# Patient Record
Sex: Male | Born: 1970 | Race: White | Hispanic: Yes | Marital: Married | Smoking: Never smoker
Health system: Southern US, Community
[De-identification: ages and names within clinical notes are randomized; demographics above are authoritative.]

## PROBLEM LIST (undated history)

## (undated) DIAGNOSIS — D1739 Benign lipomatous neoplasm of skin and subcutaneous tissue of other sites: Secondary | ICD-10-CM

## (undated) DIAGNOSIS — I10 Essential (primary) hypertension: Secondary | ICD-10-CM

## (undated) HISTORY — DX: Essential (primary) hypertension: I10

## (undated) HISTORY — DX: Benign lipomatous neoplasm of skin and subcutaneous tissue of other sites: D17.39

---

## 2007-02-12 ENCOUNTER — Emergency Department (HOSPITAL_COMMUNITY): Admission: EM | Admit: 2007-02-12 | Discharge: 2007-02-12 | Payer: Self-pay | Admitting: Emergency Medicine

## 2007-05-01 ENCOUNTER — Ambulatory Visit: Payer: Self-pay | Admitting: Family Medicine

## 2007-05-05 ENCOUNTER — Encounter (INDEPENDENT_AMBULATORY_CARE_PROVIDER_SITE_OTHER): Payer: Self-pay | Admitting: *Deleted

## 2007-05-05 LAB — CONVERTED CEMR LAB
Cholesterol: 251 mg/dL (ref 0–200)
Direct LDL: 184.2 mg/dL
Total CHOL/HDL Ratio: 6.1

## 2010-12-19 ENCOUNTER — Encounter: Payer: Self-pay | Admitting: Internal Medicine

## 2010-12-19 ENCOUNTER — Ambulatory Visit (INDEPENDENT_AMBULATORY_CARE_PROVIDER_SITE_OTHER): Payer: 59 | Admitting: Internal Medicine

## 2010-12-19 DIAGNOSIS — M549 Dorsalgia, unspecified: Secondary | ICD-10-CM | POA: Insufficient documentation

## 2010-12-19 DIAGNOSIS — L989 Disorder of the skin and subcutaneous tissue, unspecified: Secondary | ICD-10-CM | POA: Insufficient documentation

## 2010-12-19 DIAGNOSIS — Z0000 Encounter for general adult medical examination without abnormal findings: Secondary | ICD-10-CM

## 2010-12-19 DIAGNOSIS — Z136 Encounter for screening for cardiovascular disorders: Secondary | ICD-10-CM

## 2010-12-19 NOTE — Progress Notes (Signed)
  Subjective:    Patient ID: Troy [name], male    DOB: [birth year], 40 y.o.   MRN: [id number]  HPI NEW PT Needs a complete check up, in addition: --8 months ago, he smashed his R thumb, since then has a knot there although is getting smaller, mild pain. --many years h/o a skin lesion at L hip, has increased in size in the last year, no bleeding -- occ low back pain w/o radiation, usually in AM, better as he gets active   No past medical history on file.  Past Surgical History  Procedure Date  . No past surgeries    History   Social History  . Marital Status: Married    Spouse Name: N/A    Number of Children: 2  . Years of Education: N/A   Occupational History  . Holiday representative    Social History Main Topics  . Smoking status: Never Smoker   . Smokeless tobacco: Not on file  . Alcohol Use: Yes     socially   . Drug Use: No  . Sexually Active: Not on file   Other Topics Concern  . Not on file   Social History Narrative   Original from [place] but raised in the [place]   Family History  Problem Relation Age of Onset  . Hyperlipidemia      mother   . Coronary artery disease      uncle in their 55s  . Diabetes Neg Hx   . Cancer Neg Hx      Review of Systems  Constitutional: Negative for fever and fatigue.  Respiratory: Negative for cough and shortness of breath.   Cardiovascular: Negative for chest pain and leg swelling.  Gastrointestinal: Negative for nausea, vomiting, abdominal pain and blood in stool.  Genitourinary: Negative for dysuria, hematuria and difficulty urinating.       Objective:   Physical Exam  Constitutional: He is oriented to person, place, and time. He appears well-developed and well-nourished. No distress.  HENT:  Head: Normocephalic and atraumatic.  Neck: No thyromegaly present.       No neck LADs  Cardiovascular: Normal rate, regular rhythm and normal heart sounds.   No murmur heard. Pulmonary/Chest: Effort normal and breath sounds normal.  No respiratory distress. He has no wheezes. He has no rales.  Abdominal: Soft. He exhibits no distension. There is no tenderness. There is no rebound and no guarding.  Musculoskeletal: He exhibits no edema.  Neurological: He is alert and oriented to person, place, and time.  Skin: He is not diaphoretic.       At the L hip , he has a 1.5 pedunculated skin lesion, no hyperpigmented. Also R thumb (pulp) has a 2 mm induration, skin over it is normal  Psychiatric: He has a normal mood and affect. His behavior is normal. Judgment and thought content normal.          Assessment & Plan:

## 2010-12-19 NOTE — Assessment & Plan Note (Signed)
Stretching and motrin as needed Call if sx severe

## 2010-12-19 NOTE — Assessment & Plan Note (Addendum)
Td per pt ~ 08-2010 Labs +FH CAD: EKG today NSR  Counseled about a healthy diet-exercise

## 2010-12-19 NOTE — Assessment & Plan Note (Signed)
L hip-- recommend   to schedule OV for excision R thumb-- recommend observation, if not well in few months, will call for ortho referal

## 2010-12-19 NOTE — Patient Instructions (Signed)
Came back fasting: FLP CMP CBC TSH--- dx v70 Schedule another office visit for the excision of the skin lesion on the hip

## 2010-12-21 ENCOUNTER — Other Ambulatory Visit: Payer: Self-pay | Admitting: Internal Medicine

## 2010-12-21 DIAGNOSIS — Z0000 Encounter for general adult medical examination without abnormal findings: Secondary | ICD-10-CM

## 2010-12-24 ENCOUNTER — Other Ambulatory Visit: Payer: Self-pay | Admitting: Internal Medicine

## 2010-12-24 ENCOUNTER — Other Ambulatory Visit (INDEPENDENT_AMBULATORY_CARE_PROVIDER_SITE_OTHER): Payer: 59

## 2010-12-24 DIAGNOSIS — Z0000 Encounter for general adult medical examination without abnormal findings: Secondary | ICD-10-CM

## 2010-12-24 LAB — COMPREHENSIVE METABOLIC PANEL
ALT: 44 U/L (ref 0–53)
BUN: 16 mg/dL (ref 6–23)
Calcium: 9.2 mg/dL (ref 8.4–10.5)
Chloride: 107 mEq/L (ref 96–112)
Creatinine, Ser: 0.7 mg/dL (ref 0.4–1.5)
Potassium: 4.2 mEq/L (ref 3.5–5.1)
Sodium: 142 mEq/L (ref 135–145)

## 2010-12-24 LAB — LIPID PANEL: HDL: 50.7 mg/dL (ref >39.00)

## 2010-12-24 LAB — CBC WITH DIFFERENTIAL/PLATELET
Basophils Absolute: 0 10*3/uL (ref 0.0–0.1)
Basophils Relative: 0.6 % (ref 0.0–3.0)
HCT: 47.9 % (ref 39.0–52.0)
Hemoglobin: 16 g/dL (ref 13.0–17.0)
Lymphocytes Relative: 33.8 % (ref 12.0–46.0)
Lymphs Abs: 2.1 10*3/uL (ref 0.7–4.0)
MCV: 92.5 fl (ref 78.0–100.0)
Neutro Abs: 3.4 10*3/uL (ref 1.4–7.7)
Neutrophils Relative %: 55.2 % (ref 43.0–77.0)
Platelets: 205 10*3/uL (ref 150.0–400.0)
RBC: 5.17 Mil/uL (ref 4.22–5.81)

## 2010-12-27 ENCOUNTER — Telehealth: Payer: Self-pay

## 2010-12-27 DIAGNOSIS — E785 Hyperlipidemia, unspecified: Secondary | ICD-10-CM

## 2010-12-27 NOTE — Telephone Encounter (Signed)
Message copied by Beverely Low on Thu Dec 27, 2010  2:13 PM ------      Message from: Troy [name]      Created: Wed Dec 26, 2010 10:21 PM       Addendum:      I now see his LDL is very high, he has a family history of heart disease (uncle)      Please ask patient to come back in 6 months fasting to repeat his cholesterol panel dx hyperlipidemia

## 2010-12-27 NOTE — Telephone Encounter (Signed)
Message copied by Beverely Low on Thu Dec 27, 2010  2:21 PM ------      Message from: Willow Ora E      Created: Wed Dec 26, 2010 10:21 PM       Addendum:      I now see his LDL is very high, he has a family history of heart disease (uncle)      Please ask patient to come back in 6 months fasting to repeat his cholesterol panel dx hyperlipidemia

## 2010-12-27 NOTE — Telephone Encounter (Signed)
Pt aware and will call back to schedule 6 month follow up

## 2010-12-27 NOTE — Telephone Encounter (Signed)
LMTCB

## 2010-12-27 NOTE — Telephone Encounter (Signed)
Pt aware and will call to schedule 6 month. Lab order placed

## 2011-01-07 ENCOUNTER — Encounter: Payer: Self-pay | Admitting: Internal Medicine

## 2011-01-08 ENCOUNTER — Ambulatory Visit (INDEPENDENT_AMBULATORY_CARE_PROVIDER_SITE_OTHER): Payer: 59 | Admitting: Internal Medicine

## 2011-01-08 ENCOUNTER — Encounter: Payer: Self-pay | Admitting: Internal Medicine

## 2011-01-08 DIAGNOSIS — D1739 Benign lipomatous neoplasm of skin and subcutaneous tissue of other sites: Secondary | ICD-10-CM

## 2011-01-08 DIAGNOSIS — I10 Essential (primary) hypertension: Secondary | ICD-10-CM

## 2011-01-08 DIAGNOSIS — D229 Melanocytic nevi, unspecified: Secondary | ICD-10-CM

## 2011-01-08 DIAGNOSIS — D1730 Benign lipomatous neoplasm of skin and subcutaneous tissue of unspecified sites: Secondary | ICD-10-CM

## 2011-01-08 NOTE — Progress Notes (Signed)
  Subjective:    Patient ID: Troy [name], male    DOB: 31-May-1970, 40 y.o.   MRN: [id number]  HPI    Review of Systems     Objective:   Physical Exam        Assessment & Plan:  Procedure note: In a sterile fashion and under LA w/ lidocaine 2% w/o (~ 1 CC) we cut w/ scissors the pedunculated lesion of the L hip, silver nitrate used for hemostasis. Wound care discussed, to call if s/s of infection Path sent  ADDENDUM: path benign, see report

## 2012-07-24 ENCOUNTER — Emergency Department (HOSPITAL_COMMUNITY): Payer: No Typology Code available for payment source

## 2012-07-24 ENCOUNTER — Observation Stay (HOSPITAL_COMMUNITY)
Admission: EM | Admit: 2012-07-24 | Discharge: 2012-07-27 | Disposition: A | Discharge status: Home / Self Care | Payer: No Typology Code available for payment source | Attending: General Surgery | Admitting: General Surgery

## 2012-07-24 ENCOUNTER — Inpatient Hospital Stay: Admit: 2012-07-24 | Payer: Self-pay | Admitting: General Surgery

## 2012-07-24 ENCOUNTER — Encounter (HOSPITAL_COMMUNITY): Payer: Self-pay | Admitting: Emergency Medicine

## 2012-07-24 DIAGNOSIS — S0003XA Contusion of scalp, initial encounter: Secondary | ICD-10-CM | POA: Insufficient documentation

## 2012-07-24 DIAGNOSIS — S069X9A Unspecified intracranial injury with loss of consciousness of unspecified duration, initial encounter: Secondary | ICD-10-CM

## 2012-07-24 DIAGNOSIS — R413 Other amnesia: Secondary | ICD-10-CM | POA: Insufficient documentation

## 2012-07-24 DIAGNOSIS — K7689 Other specified diseases of liver: Secondary | ICD-10-CM | POA: Insufficient documentation

## 2012-07-24 DIAGNOSIS — S62102A Fracture of unspecified carpal bone, left wrist, initial encounter for closed fracture: Secondary | ICD-10-CM

## 2012-07-24 DIAGNOSIS — IMO0002 Reserved for concepts with insufficient information to code with codable children: Secondary | ICD-10-CM | POA: Insufficient documentation

## 2012-07-24 DIAGNOSIS — R109 Unspecified abdominal pain: Secondary | ICD-10-CM | POA: Insufficient documentation

## 2012-07-24 DIAGNOSIS — K5730 Diverticulosis of large intestine without perforation or abscess without bleeding: Secondary | ICD-10-CM | POA: Insufficient documentation

## 2012-07-24 DIAGNOSIS — K8020 Calculus of gallbladder without cholecystitis without obstruction: Secondary | ICD-10-CM | POA: Insufficient documentation

## 2012-07-24 DIAGNOSIS — S0990XA Unspecified injury of head, initial encounter: Secondary | ICD-10-CM | POA: Insufficient documentation

## 2012-07-24 DIAGNOSIS — S62109A Fracture of unspecified carpal bone, unspecified wrist, initial encounter for closed fracture: Secondary | ICD-10-CM

## 2012-07-24 DIAGNOSIS — S5290XA Unspecified fracture of unspecified forearm, initial encounter for closed fracture: Secondary | ICD-10-CM

## 2012-07-24 DIAGNOSIS — S1093XA Contusion of unspecified part of neck, initial encounter: Secondary | ICD-10-CM | POA: Insufficient documentation

## 2012-07-24 DIAGNOSIS — S52509A Unspecified fracture of the lower end of unspecified radius, initial encounter for closed fracture: Principal | ICD-10-CM | POA: Insufficient documentation

## 2012-07-24 DIAGNOSIS — M25539 Pain in unspecified wrist: Secondary | ICD-10-CM | POA: Insufficient documentation

## 2012-07-24 LAB — CBC WITH DIFFERENTIAL/PLATELET
HCT: 42.9 % (ref 39.0–52.0)
Lymphocytes Relative: 47 % — ABNORMAL HIGH (ref 12–46)
Lymphs Abs: 6.3 10*3/uL — ABNORMAL HIGH (ref 0.7–4.0)
MCH: 31.2 pg (ref 26.0–34.0)
MCHC: 35.9 g/dL (ref 30.0–36.0)
MCV: 86.8 fL (ref 78.0–100.0)
Neutro Abs: 5.5 10*3/uL (ref 1.7–7.7)
WBC: 13.4 10*3/uL — ABNORMAL HIGH (ref 4.0–10.5)

## 2012-07-24 LAB — POCT I-STAT, CHEM 8
Calcium, Ion: 1.16 mmol/L (ref 1.12–1.23)
Creatinine, Ser: 0.9 mg/dL (ref 0.50–1.35)
Potassium: 3 mEq/L — ABNORMAL LOW (ref 3.5–5.1)
Sodium: 144 mEq/L (ref 135–145)

## 2012-07-24 LAB — PROTIME-INR
INR: 1.01 (ref 0.00–1.49)
Prothrombin Time: 13.2 seconds (ref 11.6–15.2)

## 2012-07-24 MED ORDER — IOHEXOL 300 MG/ML  SOLN
80.0000 mL | Freq: Once | INTRAMUSCULAR | Status: AC | PRN
  Administered 2012-07-24: 80 mL via INTRAVENOUS

## 2012-07-24 MED ORDER — HYDROMORPHONE HCL PF 1 MG/ML IJ SOLN
INTRAMUSCULAR | Status: AC
  Filled 2012-07-24: qty 1

## 2012-07-24 MED ORDER — PANTOPRAZOLE SODIUM 40 MG IV SOLR
40.0000 mg | Freq: Every day | INTRAVENOUS | Status: DC
  Administered 2012-07-25: 40 mg via INTRAVENOUS
  Filled 2012-07-24 (×3): qty 40

## 2012-07-24 MED ORDER — PANTOPRAZOLE SODIUM 40 MG PO TBEC
40.0000 mg | DELAYED_RELEASE_TABLET | Freq: Every day | ORAL | Status: DC
  Administered 2012-07-26 – 2012-07-27 (×2): 40 mg via ORAL
  Filled 2012-07-24 (×2): qty 1

## 2012-07-24 MED ORDER — HYDROMORPHONE HCL PF 1 MG/ML IJ SOLN
1.0000 mg | Freq: Once | INTRAMUSCULAR | Status: AC
  Administered 2012-07-24: 1 mg via INTRAVENOUS
  Filled 2012-07-24: qty 1

## 2012-07-24 MED ORDER — ONDANSETRON HCL 4 MG/2ML IJ SOLN
INTRAMUSCULAR | Status: AC
  Filled 2012-07-24: qty 2

## 2012-07-24 MED ORDER — ONDANSETRON HCL 4 MG/2ML IJ SOLN
4.0000 mg | Freq: Four times a day (QID) | INTRAMUSCULAR | Status: DC | PRN
  Administered 2012-07-24: 4 mg via INTRAVENOUS
  Filled 2012-07-24: qty 2

## 2012-07-24 MED ORDER — HYDROMORPHONE HCL PF 1 MG/ML IJ SOLN
1.0000 mg | Freq: Once | INTRAMUSCULAR | Status: AC
  Administered 2012-07-24: 1 mg via INTRAVENOUS

## 2012-07-24 MED ORDER — FENTANYL CITRATE 0.05 MG/ML IJ SOLN
100.0000 ug | Freq: Once | INTRAMUSCULAR | Status: AC
  Administered 2012-07-24: 100 ug via INTRAVENOUS
  Filled 2012-07-24: qty 2

## 2012-07-24 MED ORDER — ONDANSETRON HCL 4 MG PO TABS: 4.0000 mg | ORAL_TABLET | Freq: Four times a day (QID) | ORAL | Status: DC | PRN

## 2012-07-24 MED ORDER — ONDANSETRON HCL 4 MG/2ML IJ SOLN
4.0000 mg | Freq: Once | INTRAMUSCULAR | Status: AC
  Administered 2012-07-24: 4 mg via INTRAVENOUS

## 2012-07-24 MED ORDER — DEXTROSE-NACL 5-0.9 % IV SOLN
INTRAVENOUS | Status: DC
  Administered 2012-07-24: 23:00:00 via INTRAVENOUS

## 2012-07-24 MED ORDER — HYDROMORPHONE HCL PF 1 MG/ML IJ SOLN
1.0000 mg | INTRAMUSCULAR | Status: DC | PRN
  Administered 2012-07-24: 1 mg via INTRAVENOUS
  Filled 2012-07-24: qty 1
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
## 2012-07-24 NOTE — ED Notes (Signed)
Called and updated OR on pt status.
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
## 2012-07-24 NOTE — ED Notes (Signed)
Hand surgeon in room at this time

## 2012-07-24 NOTE — Preoperative (Signed)
Beta Blockers   Reason not to administer Beta Blockers:Not Applicable 
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
## 2012-07-25 ENCOUNTER — Encounter (HOSPITAL_COMMUNITY): Payer: Self-pay | Admitting: Certified Registered Nurse Anesthetist

## 2012-07-25 ENCOUNTER — Encounter (HOSPITAL_COMMUNITY): Admission: EM | Disposition: A | Discharge status: Home / Self Care | Payer: Self-pay | Source: Home / Self Care

## 2012-07-25 ENCOUNTER — Inpatient Hospital Stay (HOSPITAL_COMMUNITY): Payer: No Typology Code available for payment source | Admitting: Certified Registered Nurse Anesthetist

## 2012-07-25 ENCOUNTER — Encounter (HOSPITAL_COMMUNITY): Payer: Self-pay | Admitting: Anesthesiology

## 2012-07-25 HISTORY — PX: ORIF RADIAL FRACTURE: SHX5113

## 2012-07-25 SURGERY — OPEN REDUCTION INTERNAL FIXATION (ORIF) RADIAL FRACTURE
Anesthesia: General | Site: Arm Lower | Laterality: Left | Wound class: Clean

## 2012-07-25 MED ORDER — LIDOCAINE HCL (CARDIAC) 20 MG/ML IV SOLN
INTRAVENOUS | Status: DC | PRN
  Administered 2012-07-25: 100 mg via INTRAVENOUS

## 2012-07-25 MED ORDER — BUPIVACAINE HCL 0.25 % IJ SOLN
INTRAMUSCULAR | Status: DC | PRN
  Administered 2012-07-25: 18 mL

## 2012-07-25 MED ORDER — OXYCODONE HCL 5 MG/5ML PO SOLN: 5.0000 mg | Freq: Once | ORAL | Status: AC | PRN

## 2012-07-25 MED ORDER — ONDANSETRON HCL 4 MG/2ML IJ SOLN
INTRAMUSCULAR | Status: DC | PRN
  Administered 2012-07-25: 4 mg via INTRAVENOUS

## 2012-07-25 MED ORDER — CEFAZOLIN SODIUM-DEXTROSE 2-3 GM-% IV SOLR
INTRAVENOUS | Status: DC | PRN
  Administered 2012-07-25: 2 g via INTRAVENOUS

## 2012-07-25 MED ORDER — HYDROMORPHONE HCL PF 1 MG/ML IJ SOLN
1.0000 mg | INTRAMUSCULAR | Status: DC | PRN
  Administered 2012-07-25 (×2): 1 mg via INTRAVENOUS
  Administered 2012-07-25 – 2012-07-26 (×2): 2 mg via INTRAVENOUS
  Administered 2012-07-26: 1 mg via INTRAVENOUS
  Filled 2012-07-25: qty 2
  Filled 2012-07-25 (×2): qty 1
  Filled 2012-07-25: qty 2
  Filled 2012-07-25: qty 1

## 2012-07-25 MED ORDER — OXYCODONE HCL 5 MG PO TABS
5.0000 mg | ORAL_TABLET | Freq: Once | ORAL | Status: AC | PRN
  Administered 2012-07-25: 5 mg via ORAL
  Filled 2012-07-25: qty 1

## 2012-07-25 MED ORDER — HYDROMORPHONE HCL PF 1 MG/ML IJ SOLN
1.0000 mg | Freq: Once | INTRAMUSCULAR | Status: AC
  Administered 2012-07-25: 1 mg via INTRAVENOUS
  Filled 2012-07-25: qty 1

## 2012-07-25 MED ORDER — FENTANYL CITRATE 0.05 MG/ML IJ SOLN
INTRAMUSCULAR | Status: DC | PRN
  Administered 2012-07-25 (×5): 50 ug via INTRAVENOUS

## 2012-07-25 MED ORDER — METOPROLOL TARTRATE 1 MG/ML IV SOLN
INTRAVENOUS | Status: DC | PRN
  Administered 2012-07-25: 2 mg via INTRAVENOUS
  Administered 2012-07-25: 1 mg via INTRAVENOUS

## 2012-07-25 MED ORDER — LACTATED RINGERS IV SOLN
INTRAVENOUS | Status: DC | PRN
  Administered 2012-07-25 (×2): via INTRAVENOUS

## 2012-07-25 MED ORDER — 0.9 % SODIUM CHLORIDE (POUR BTL) OPTIME
TOPICAL | Status: DC | PRN
  Administered 2012-07-25: 1000 mL

## 2012-07-25 MED ORDER — MIDAZOLAM HCL 5 MG/5ML IJ SOLN
INTRAMUSCULAR | Status: DC | PRN
  Administered 2012-07-25: 2 mg via INTRAVENOUS

## 2012-07-25 MED ORDER — CHLORHEXIDINE GLUCONATE 0.12 % MT SOLN: 15.0000 mL | Freq: Two times a day (BID) | OROMUCOSAL | Status: DC

## 2012-07-25 MED ORDER — BIOTENE DRY MOUTH MT LIQD
15.0000 mL | Freq: Two times a day (BID) | OROMUCOSAL | Status: DC
  Administered 2012-07-25: 15 mL via OROMUCOSAL

## 2012-07-25 MED ORDER — DEXAMETHASONE SODIUM PHOSPHATE 4 MG/ML IJ SOLN
INTRAMUSCULAR | Status: DC | PRN
  Administered 2012-07-25: 8 mg via INTRAVENOUS

## 2012-07-25 MED ORDER — METOCLOPRAMIDE HCL 5 MG/ML IJ SOLN
INTRAMUSCULAR | Status: DC | PRN
  Administered 2012-07-25: 10 mg via INTRAVENOUS

## 2012-07-25 MED ORDER — PROPOFOL 10 MG/ML IV BOLUS
INTRAVENOUS | Status: DC | PRN
  Administered 2012-07-25: 200 mg via INTRAVENOUS

## 2012-07-25 MED ORDER — METOCLOPRAMIDE HCL 5 MG/ML IJ SOLN: 10.0000 mg | Freq: Once | INTRAMUSCULAR | Status: AC | PRN

## 2012-07-25 MED ORDER — HYDROMORPHONE HCL PF 1 MG/ML IJ SOLN
0.2500 mg | INTRAMUSCULAR | Status: DC | PRN
  Administered 2012-07-25: via INTRAVENOUS
  Filled 2012-07-25 (×2): qty 1

## 2012-07-25 SURGICAL SUPPLY — 46 items
BANDAGE ELASTIC 3 VELCRO ST LF (GAUZE/BANDAGES/DRESSINGS) ×2 IMPLANT
BANDAGE ELASTIC 4 VELCRO ST LF (GAUZE/BANDAGES/DRESSINGS) ×1 IMPLANT
BNDG CMPR 9X4 STRL LF SNTH (GAUZE/BANDAGES/DRESSINGS) ×1
BNDG ESMARK 4X9 LF (GAUZE/BANDAGES/DRESSINGS) ×1 IMPLANT
CANISTER SUCTION 1500CC (MISCELLANEOUS) ×2 IMPLANT
CHLORAPREP W/TINT 26ML (MISCELLANEOUS) ×2 IMPLANT
CLOTH BEACON ORANGE TIMEOUT ST (SAFETY) ×2 IMPLANT
CORDS BIPOLAR (ELECTRODE) ×2 IMPLANT
COVER SURGICAL LIGHT HANDLE (MISCELLANEOUS) ×2 IMPLANT
CUFF TOURNIQUET SINGLE 18IN (TOURNIQUET CUFF) ×2 IMPLANT
CUFF TOURNIQUET SINGLE 24IN (TOURNIQUET CUFF) IMPLANT
DRAIN TLS ROUND 10FR (DRAIN) IMPLANT
DRAPE OEC MINIVIEW 54X84 (DRAPES) ×2 IMPLANT
DRAPE SURG 17X23 STRL (DRAPES) ×2 IMPLANT
GAUZE XEROFORM 1X8 LF (GAUZE/BANDAGES/DRESSINGS) ×2 IMPLANT
GLOVE BIOGEL M STRL SZ7.5 (GLOVE) ×2 IMPLANT
GOWN PREVENTION PLUS XLARGE (GOWN DISPOSABLE) ×2 IMPLANT
GOWN STRL REIN XL XLG (GOWN DISPOSABLE) ×2 IMPLANT
KIT BASIN OR (CUSTOM PROCEDURE TRAY) ×2 IMPLANT
NEEDLE HYPO 22GX1.5 SAFETY (NEEDLE) ×1 IMPLANT
NS IRRIG 1000ML POUR BTL (IV SOLUTION) ×2 IMPLANT
PACK ORTHO EXTREMITY (CUSTOM PROCEDURE TRAY) ×2 IMPLANT
PAD CAST 3X4 CTTN HI CHSV (CAST SUPPLIES) IMPLANT
PAD CAST 4YDX4 CTTN HI CHSV (CAST SUPPLIES) IMPLANT
PADDING CAST ABS 4INX4YD NS (CAST SUPPLIES) ×1
PADDING CAST ABS COTTON 4X4 ST (CAST SUPPLIES) ×1 IMPLANT
PADDING CAST COTTON 3X4 STRL (CAST SUPPLIES)
PADDING CAST COTTON 4X4 STRL (CAST SUPPLIES) ×2
PEG THREADED 2.5MMX20MM LONG (Peg) ×7 IMPLANT
PLATE SHORT 21.6X48.9 NRRW LT (Plate) ×1 IMPLANT
SCREW CORT 3.5X16 LNG (Screw) ×3 IMPLANT
SPLINT FIBERGLASS 4X15 (CAST SUPPLIES) ×1 IMPLANT
SPLINT PLASTER CAST XFAST 4X15 (CAST SUPPLIES) IMPLANT
SPLINT PLASTER XTRA FAST SET 4 (CAST SUPPLIES)
SPONGE GAUZE 4X4 12PLY (GAUZE/BANDAGES/DRESSINGS) ×1 IMPLANT
SUT ETHILON 3 0 PS 1 (SUTURE) ×2 IMPLANT
SUT ETHILON 4 0 PS 2 18 (SUTURE) ×2 IMPLANT
SUT VIC AB 3-0 PS1 18 (SUTURE) ×2
SUT VIC AB 3-0 PS1 18XBRD (SUTURE) ×1 IMPLANT
SUT VICRYL 4-0 PS2 18IN ABS (SUTURE) ×2 IMPLANT
SYR BULB 3OZ (MISCELLANEOUS) ×2 IMPLANT
SYR CONTROL 10ML LL (SYRINGE) ×1 IMPLANT
TOWEL OR 17X24 6PK STRL BLUE (TOWEL DISPOSABLE) ×2 IMPLANT
TUBE CONNECTING 20X1/4 (TUBING) ×2 IMPLANT
UNDERPAD 30X30 INCONTINENT (UNDERPADS AND DIAPERS) ×2 IMPLANT
WATER STERILE IRR 1000ML POUR (IV SOLUTION) ×2 IMPLANT
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
## 2012-07-25 NOTE — Transfer of Care (Signed)
Immediate Anesthesia Transfer of Care Note  Patient: Troy [name]  Procedure(s) Performed: Procedure(s): OPEN REDUCTION INTERNAL FIXATION (ORIF) Distal RADIus FRACTURE (Left)  Patient Location: PACU  Anesthesia Type:General  Level of Consciousness: sedated  Airway & Oxygen Therapy: Patient Spontanous Breathing and Patient connected to nasal cannula oxygen  Post-op Assessment: Report given to PACU RN and Post -op Vital signs reviewed and stable  Post vital signs: Reviewed and stable  Complications: No apparent anesthesia complications

## 2012-07-25 NOTE — Anesthesia Postprocedure Evaluation (Signed)
Anesthesia Post Note  Patient: Troy [name]  Procedure(s) Performed: Procedure(s) (LRB): OPEN REDUCTION INTERNAL FIXATION (ORIF) Distal RADIus FRACTURE (Left)  Anesthesia type: General  Patient location: PACU  Post pain: Pain level controlled  Post assessment: Patient's Cardiovascular Status Stable  Last Vitals:  Filed Vitals:   07/25/12 0958  BP: 139/79  Pulse: 93  Temp: 36.7 C  Resp: 9    Post vital signs: Reviewed and stable  Level of consciousness: alert  Complications: No apparent anesthesia complications

## 2012-07-25 NOTE — Anesthesia Preprocedure Evaluation (Addendum)
Anesthesia Evaluation  Patient identified by MRN, date of birth, ID band Patient awake    Reviewed: Allergy & Precautions, H&P , NPO status , Patient's Chart, lab work & pertinent test results, reviewed documented beta blocker date and time   Airway Mallampati: II TM Distance: >3 FB Neck ROM: full    Dental  (+) Dental Advisory Given and Teeth Intact   Pulmonary neg pulmonary ROS,  breath sounds clear to auscultation        Cardiovascular negative cardio ROS  Rhythm:regular     Neuro/Psych negative neurological ROS  negative psych ROS   GI/Hepatic negative GI ROS, Neg liver ROS,   Endo/Other  negative endocrine ROS  Renal/GU negative Renal ROS  negative genitourinary   Musculoskeletal   Abdominal   Peds  Hematology negative hematology ROS (+)   Anesthesia Other Findings See surgeon's H&P   Reproductive/Obstetrics negative OB ROS                          Anesthesia Physical Anesthesia Plan  ASA: I  Anesthesia Plan: General   Post-op Pain Management:    Induction: Intravenous  Airway Management Planned: LMA  Additional Equipment:   Intra-op Plan:   Post-operative Plan:   Informed Consent: I have reviewed the patients History and Physical, chart, labs and discussed the procedure including the risks, benefits and alternatives for the proposed anesthesia with the patient or authorized representative who has indicated his/her understanding and acceptance.   Dental Advisory Given  Plan Discussed with: CRNA and Surgeon  Anesthesia Plan Comments:         Anesthesia Quick Evaluation
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
## 2012-07-25 NOTE — Anesthesia Procedure Notes (Signed)
Procedure Name: LMA Insertion Date/Time: 07/25/2012 8:37 AM Performed by: Alanda Amass A Pre-anesthesia Checklist: Patient identified, Timeout performed, Emergency Drugs available, Suction available and Patient being monitored Patient Re-evaluated:Patient Re-evaluated prior to inductionOxygen Delivery Method: Circle system utilized Preoxygenation: Pre-oxygenation with 100% oxygen Intubation Type: IV induction Ventilation: Mask ventilation without difficulty LMA Size: 5.0 Tube type: Oral Number of attempts: 1 Tube secured with: Tape Dental Injury: Teeth and Oropharynx as per pre-operative assessment

## 2012-07-25 NOTE — Op Note (Signed)
07/24/2012 - 07/25/2012  9:45 AM  PATIENT:  Marcello Moores  42 y.o. male  PRE-OPERATIVE DIAGNOSIS:  Left Distal Radius Fracture  POST-OPERATIVE DIAGNOSIS:  same  PROCEDURE:  Procedure(s): OPEN REDUCTION INTERNAL FIXATION (ORIF) Distal RADIus FRACTURE  SURGEON:  Surgeon(s): Johnette Abraham, MD  ANESTHESIA:   general  SPECIMEN:  No Specimen  FINDINGS:  comminuted intraarticular fx  DISPOSITION OF SPECIMEN:  {SPECIMEN DISPOSITION:[id number]  PATIENT DISPOSITION:  PACU - hemodynamically stable.

## 2012-07-26 ENCOUNTER — Observation Stay (HOSPITAL_COMMUNITY): Payer: No Typology Code available for payment source

## 2012-07-26 MED ORDER — OXYCODONE HCL 5 MG PO TABS: 5.0000 mg | ORAL_TABLET | ORAL | Status: DC | PRN

## 2012-07-26 MED ORDER — OXYCODONE HCL 5 MG PO TABS
5.0000 mg | ORAL_TABLET | ORAL | Status: DC | PRN
  Administered 2012-07-26 – 2012-07-27 (×5): 10 mg via ORAL
  Filled 2012-07-26 (×5): qty 2

## 2012-07-26 MED ORDER — ACETAMINOPHEN 325 MG PO TABS: ORAL_TABLET | ORAL | Status: DC

## 2012-07-26 MED ORDER — HYDROMORPHONE HCL PF 1 MG/ML IJ SOLN
0.5000 mg | INTRAMUSCULAR | Status: DC | PRN
  Administered 2012-07-26: 0.5 mg via INTRAVENOUS
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
## 2012-07-26 NOTE — Progress Notes (Signed)
Pt wants a repeat CT scan so I will get one now while he is here.

## 2012-07-26 NOTE — Progress Notes (Signed)
Orthopedic Tech Progress Note Patient Details:  Troy [name] [birth year] [id number]  Ortho Devices Type of Ortho Device: Other (comment) (sling and swathe) Ortho Device/Splint Location: left arm Ortho Device/Splint Interventions: Application   Nikki Dom 07/26/2012, 6:42 PM
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
## 2012-07-27 DIAGNOSIS — S0990XA Unspecified injury of head, initial encounter: Secondary | ICD-10-CM

## 2012-07-27 DIAGNOSIS — S62109A Fracture of unspecified carpal bone, unspecified wrist, initial encounter for closed fracture: Secondary | ICD-10-CM

## 2012-07-27 LAB — PATHOLOGIST SMEAR REVIEW

## 2012-07-27 MED ORDER — OXYCODONE-ACETAMINOPHEN 5-325 MG PO TABS: 1.0000 | ORAL_TABLET | ORAL | Status: DC | PRN
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
## 2012-07-27 NOTE — Progress Notes (Signed)
Patient examined and I agree with the assessment and plan  Violeta Gelinas, MD, MPH, FACS Pager: [phone]  07/27/2012 4:10 PM

## 2012-07-27 NOTE — Progress Notes (Signed)
Patient and wife received discharge instructions and prescription from Alona Bene, Charity fundraiser, Press photographer.  All questions answered.  Patient discharged home with wife.

## 2012-07-28 ENCOUNTER — Encounter (HOSPITAL_COMMUNITY): Payer: Self-pay | Admitting: General Surgery

## 2012-08-18 ENCOUNTER — Encounter: Payer: Self-pay | Admitting: Gastroenterology

## 2012-08-18 ENCOUNTER — Encounter: Payer: Self-pay | Admitting: Family Medicine

## 2012-08-18 ENCOUNTER — Ambulatory Visit (INDEPENDENT_AMBULATORY_CARE_PROVIDER_SITE_OTHER): Payer: 59 | Admitting: Family Medicine

## 2012-08-18 VITALS — BP 132/88 | HR 72 | Temp 99.1°F | Wt 180.4 lb

## 2012-08-18 DIAGNOSIS — R195 Other fecal abnormalities: Secondary | ICD-10-CM

## 2012-08-18 DIAGNOSIS — K625 Hemorrhage of anus and rectum: Secondary | ICD-10-CM

## 2012-08-18 DIAGNOSIS — K5900 Constipation, unspecified: Secondary | ICD-10-CM

## 2012-08-18 DIAGNOSIS — R110 Nausea: Secondary | ICD-10-CM

## 2012-08-18 MED ORDER — OMEPRAZOLE 40 MG PO CPDR: 40.0000 mg | DELAYED_RELEASE_CAPSULE | Freq: Every day | ORAL | Status: DC

## 2012-08-18 NOTE — Assessment & Plan Note (Signed)
Miralax, stool softener Secondary to narcotics

## 2012-08-18 NOTE — Patient Instructions (Addendum)
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
## 2012-08-18 NOTE — Assessment & Plan Note (Signed)
Omeprazole Referral to GI

## 2012-08-28 ENCOUNTER — Ambulatory Visit (INDEPENDENT_AMBULATORY_CARE_PROVIDER_SITE_OTHER): Payer: 59 | Admitting: Gastroenterology

## 2012-08-28 ENCOUNTER — Encounter: Payer: Self-pay | Admitting: Gastroenterology

## 2012-08-28 ENCOUNTER — Other Ambulatory Visit (INDEPENDENT_AMBULATORY_CARE_PROVIDER_SITE_OTHER): Payer: 59

## 2012-08-28 VITALS — BP 120/100 | HR 74 | Ht 64.5 in | Wt 180.4 lb

## 2012-08-28 DIAGNOSIS — K7689 Other specified diseases of liver: Secondary | ICD-10-CM

## 2012-08-28 DIAGNOSIS — K5730 Diverticulosis of large intestine without perforation or abscess without bleeding: Secondary | ICD-10-CM

## 2012-08-28 DIAGNOSIS — K625 Hemorrhage of anus and rectum: Secondary | ICD-10-CM

## 2012-08-28 DIAGNOSIS — K8020 Calculus of gallbladder without cholecystitis without obstruction: Secondary | ICD-10-CM

## 2012-08-28 DIAGNOSIS — K760 Fatty (change of) liver, not elsewhere classified: Secondary | ICD-10-CM

## 2012-08-28 LAB — CBC WITH DIFFERENTIAL/PLATELET
Basophils Absolute: 0.1 10*3/uL (ref 0.0–0.1)
Hemoglobin: 15.6 g/dL (ref 13.0–17.0)
Lymphocytes Relative: 27.4 % (ref 12.0–46.0)
Monocytes Relative: 8.8 % (ref 3.0–12.0)
Neutro Abs: 5.3 10*3/uL (ref 1.4–7.7)
RDW: 13.1 % (ref 11.5–14.6)
WBC: 8.6 10*3/uL (ref 4.5–10.5)

## 2012-08-28 LAB — HEPATIC FUNCTION PANEL
ALT: 56 U/L — ABNORMAL HIGH (ref 0–53)
AST: 29 U/L (ref 0–37)
Alkaline Phosphatase: 80 U/L (ref 39–117)
Bilirubin, Direct: 0.1 mg/dL (ref 0.0–0.3)
Total Bilirubin: 0.7 mg/dL (ref 0.3–1.2)

## 2012-08-28 LAB — IBC PANEL
Saturation Ratios: 19.5 % — ABNORMAL LOW (ref 20.0–50.0)
Transferrin: 245.6 mg/dL (ref 212.0–360.0)

## 2012-08-28 LAB — FERRITIN: Ferritin: 527.2 ng/mL — ABNORMAL HIGH (ref 22.0–322.0)

## 2012-08-28 MED ORDER — MOVIPREP 100 G PO SOLR: 1.0000 | Freq: Once | ORAL | Status: DC
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
## 2012-08-28 NOTE — Patient Instructions (Addendum)
You have been scheduled for a colonoscopy with propofol. Please follow written instructions given to you at your visit today.  Please pick up your prep kit at the pharmacy within the next 1-3 days. If you use inhalers (even only as needed), please bring them with you on the day of your procedure. Your physician has requested that you go to www.startemmi.com and enter the access code given to you at your visit today. This web site gives a general overview about your procedure. However, you should still follow specific instructions given to you by our office regarding your preparation for the procedure.  Your physician has requested that you go to the basement for lab work before leaving today: Anemia Panel, and Hepatic Function Panel  Please purchase Metamucil over the counter. Take as directed.  Please follow high fiber diet below.  You watched a video on Diverticulosis today.   ______________________________________________________________________________________________________________   High-Fiber Diet Fiber is found in fruits, vegetables, and grains. A high-fiber diet encourages the addition of more whole grains, legumes, fruits, and vegetables in your diet. The recommended amount of fiber for adult males is 38 g per day. For adult females, it is 25 g per day. Pregnant and lactating women should get 28 g of fiber per day. If you have a digestive or bowel problem, ask your caregiver for advice before adding high-fiber foods to your diet. Eat a variety of high-fiber foods instead of only a select few type of foods.  PURPOSE  To increase stool bulk.  To make bowel movements more regular to prevent constipation.  To lower cholesterol.  To prevent overeating. WHEN IS THIS DIET USED?  It may be used if you have constipation and hemorrhoids.  It may be used if you have uncomplicated diverticulosis (intestine condition) and irritable bowel syndrome.  It may be used if you need help  with weight management.  It may be used if you want to add it to your diet as a protective measure against atherosclerosis, diabetes, and cancer. SOURCES OF FIBER  Whole-grain breads and cereals.  Fruits, such as apples, oranges, bananas, berries, prunes, and pears.  Vegetables, such as green peas, carrots, sweet potatoes, beets, broccoli, cabbage, spinach, and artichokes.  Legumes, such split peas, soy, lentils.  Almonds. FIBER CONTENT IN FOODS Starches and Grains / Dietary Fiber (g)  Cheerios, 1 cup / 3 g  Corn Flakes cereal, 1 cup / 0.7 g  Rice crispy treat cereal, 1 cup / 0.3 g  Instant oatmeal (cooked),  cup / 2 g  Frosted wheat cereal, 1 cup / 5.1 g  Brown, long-grain rice (cooked), 1 cup / 3.5 g  White, long-grain rice (cooked), 1 cup / 0.6 g  Enriched macaroni (cooked), 1 cup / 2.5 g Legumes / Dietary Fiber (g)  Baked beans (canned, plain, or vegetarian),  cup / 5.2 g  Kidney beans (canned),  cup / 6.8 g  Pinto beans (cooked),  cup / 5.5 g Breads and Crackers / Dietary Fiber (g)  Plain or honey graham crackers, 2 squares / 0.7 g  Saltine crackers, 3 squares / 0.3 g  Plain, salted pretzels, 10 pieces / 1.8 g  Whole-wheat bread, 1 slice / 1.9 g  White bread, 1 slice / 0.7 g  Raisin bread, 1 slice / 1.2 g  Plain bagel, 3 oz / 2 g  Flour tortilla, 1 oz / 0.9 g  Corn tortilla, 1 small / 1.5 g  Hamburger or hotdog bun, 1 small / 0.9  g Fruits / Dietary Fiber (g)  Apple with skin, 1 medium / 4.4 g  Sweetened applesauce,  cup / 1.5 g  Banana,  medium / 1.5 g  Grapes, 10 grapes / 0.4 g  Orange, 1 small / 2.3 g  Raisin, 1.5 oz / 1.6 g  Melon, 1 cup / 1.4 g Vegetables / Dietary Fiber (g)  Green beans (canned),  cup / 1.3 g  Carrots (cooked),  cup / 2.3 g  Broccoli (cooked),  cup / 2.8 g  Peas (cooked),  cup / 4.4 g  Mashed potatoes,  cup / 1.6 g  Lettuce, 1 cup / 0.5 g  Corn (canned),  cup / 1.6 g  Tomato,  cup / 1.1  g Document Released: 04/01/2005 Document Revised: 10/01/2011 Document Reviewed: 07/04/2011 [facility] Patient Information 2013 [place], [place].                                                        We are excited to introduce MyChart, a new best-in-class service that provides you online access to important information in your electronic medical record. We want to make it easier for you to view your health information - all in one secure location - when and where you need it. We expect MyChart will enhance the quality of care and service we provide.  When you register for MyChart, you can:    View your test results.    Request appointments and receive appointment reminders via email.    Request medication renewals.    View your medical history, allergies, medications and immunizations.    Communicate with your physician's office through a password-protected site.    Conveniently print information such as your medication lists.  To find out if MyChart is right for you, please talk to a member of our clinical staff today. We will gladly answer your questions about this free health and wellness tool.  If you are age 40 or older and want a member of your family to have access to your record, you must provide written consent by completing a proxy form available at our office. Please speak to our clinical staff about guidelines regarding accounts for patients younger than age 48.  As you activate your MyChart account and need any technical assistance, please call the MyChart technical support line at [phone]) or email your question to [email or web address]. If you email your question(s), please include your name, a return phone number and the best time to reach you.  If you have non-urgent health-related questions, you can send a message to our office through MyChart at [email or web address]. If you have a medical emergency, call 911.  Thank you for using  MyChart as your new health and wellness resource!   MyChart licensed from [employer],  4540-9811. Patents Pending.

## 2012-09-04 ENCOUNTER — Ambulatory Visit (AMBULATORY_SURGERY_CENTER): Payer: 59 | Admitting: Gastroenterology

## 2012-09-04 ENCOUNTER — Encounter: Payer: Self-pay | Admitting: Gastroenterology

## 2012-09-04 VITALS — BP 122/85 | HR 69 | Temp 97.8°F | Resp 20 | Ht 65.0 in | Wt 180.0 lb

## 2012-09-04 DIAGNOSIS — K5730 Diverticulosis of large intestine without perforation or abscess without bleeding: Secondary | ICD-10-CM

## 2012-09-04 DIAGNOSIS — K7689 Other specified diseases of liver: Secondary | ICD-10-CM

## 2012-09-04 DIAGNOSIS — K625 Hemorrhage of anus and rectum: Secondary | ICD-10-CM

## 2012-09-04 DIAGNOSIS — K8020 Calculus of gallbladder without cholecystitis without obstruction: Secondary | ICD-10-CM

## 2012-09-04 DIAGNOSIS — Z1211 Encounter for screening for malignant neoplasm of colon: Secondary | ICD-10-CM

## 2012-09-04 DIAGNOSIS — D126 Benign neoplasm of colon, unspecified: Secondary | ICD-10-CM

## 2012-09-04 MED ORDER — SODIUM CHLORIDE 0.9 % IV SOLN: 500.0000 mL | INTRAVENOUS | Status: DC

## 2012-09-04 NOTE — Patient Instructions (Addendum)
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
## 2012-09-04 NOTE — Progress Notes (Signed)
Called to room to assist during endoscopic procedure.  Patient ID and intended procedure confirmed with present staff. Received instructions for my participation in the procedure from the performing physician.  

## 2012-09-04 NOTE — Op Note (Signed)
[facility] [address] [place], [address]   COLONOSCOPY PROCEDURE REPORT  PATIENT: Troy [name], Troy [name]  MR#: [id number] BIRTHDATE: Mar 22, 1971 , 42  yrs. old GENDER: Male ENDOSCOPIST: Mardella Layman, MD, [facility] REFERRED BY:  Willow Ora, M.D. PROCEDURE DATE:  09/04/2012 PROCEDURE:   Colonoscopy with biopsy ASA CLASS:   Class II INDICATIONS:Colorectal cancer screening and Rectal Bleeding. MEDICATIONS: propofol (Diprivan) 250mg  IV  DESCRIPTION OF PROCEDURE:   After the risks and benefits and of the procedure were explained, informed consent was obtained.  A digital rectal exam revealed no abnormalities of the rectum.    The LB [id number]  endoscope was introduced through the anus and advanced to the cecum, which was identified by both the appendix and ileocecal valve .  The quality of the prep was excellent, using MoviPrep .  The instrument was then slowly withdrawn as the colon was fully examined.     COLON FINDINGS: Moderate diverticulosis was noted in the descending colon and sigmoid colon.  No bleeding was noted from the diverticulosis.   A smooth flat polyp ranging between 3-17mm in size was found in the rectum.  A biopsy was performed using cold forceps.   A normal appearing cecum, ileocecal valve, and appendiceal orifice were identified.  The ascending, hepatic flexure, transverse, splenic flexure, descending, sigmoid colon and rectum appeared unremarkable.  No polyps or cancers were seen. Retroflexed views revealed no abnormalities.     The scope was then withdrawn from the patient and the procedure completed.  COMPLICATIONS: There were no complications. ENDOSCOPIC IMPRESSION: 1.   Moderate diverticulosis was noted in the descending colon and sigmoid colon 2.   Flat polyp ranging between 3-53mm in size was found in the rectum; biopsy was performed using cold forceps ,r/o adenoma 3. Rectum otherwise normal,no large hemorrhoids noted  today.  RECOMMENDATIONS: 1.  Await biopsy results 2.  Repeat colonoscopy in 5 years if polyp adenomatous; otherwise 10 years 3.  High fiber diet with liberal fluid intake. 4.  Metamucil or benefiber   REPEAT EXAM:  cc:  _______________________________ eSignedMardella Layman, MD, [facility] 09/04/2012 3:21 PM

## 2012-09-08 ENCOUNTER — Telehealth: Payer: Self-pay | Admitting: *Deleted

## 2012-09-08 NOTE — Telephone Encounter (Signed)
  Follow up Call-  Call back number 09/04/2012  Post procedure Call Back phone  # [phone]  Permission to leave phone message Yes     Patient questions:  Do you have a fever, pain , or abdominal swelling? no Pain Score  0 *  Have you tolerated food without any problems? yes  Have you been able to return to your normal activities? yes  Do you have any questions about your discharge instructions: Diet   no Medications  no Follow up visit  no  Do you have questions or concerns about your Care? no  Actions: * If pain score is 4 or above: No action needed, pain <4.

## 2012-09-11 ENCOUNTER — Encounter: Payer: Self-pay | Admitting: Gastroenterology

## 2013-07-10 ENCOUNTER — Ambulatory Visit: Payer: 59 | Admitting: Family Medicine

## 2014-01-12 ENCOUNTER — Telehealth: Payer: Self-pay | Admitting: *Deleted

## 2014-01-12 NOTE — Telephone Encounter (Signed)
Received medical record request from [place] [place] via mail for patient. Request forwarded to medical records. JG//CMA

## 2014-03-14 ENCOUNTER — Encounter: Payer: Self-pay | Admitting: Physician Assistant

## 2014-03-14 ENCOUNTER — Ambulatory Visit (INDEPENDENT_AMBULATORY_CARE_PROVIDER_SITE_OTHER): Payer: 59 | Admitting: Physician Assistant

## 2014-03-14 VITALS — BP 142/100 | HR 74 | Temp 98.3°F | Resp 16 | Ht 65.0 in | Wt 188.2 lb

## 2014-03-14 DIAGNOSIS — M47814 Spondylosis without myelopathy or radiculopathy, thoracic region: Secondary | ICD-10-CM

## 2014-03-14 DIAGNOSIS — J029 Acute pharyngitis, unspecified: Secondary | ICD-10-CM | POA: Insufficient documentation

## 2014-03-14 MED ORDER — MELOXICAM 15 MG PO TABS
15.0000 mg | ORAL_TABLET | Freq: Every day | ORAL | Status: DC
Start: 2014-03-14 — End: 2014-07-01

## 2014-03-14 MED ORDER — METHOCARBAMOL 500 MG PO TABS: 500.0000 mg | ORAL_TABLET | Freq: Every evening | ORAL | Status: DC | PRN

## 2014-03-14 NOTE — Patient Instructions (Signed)
For sore throat -- your symptoms seem viral in nature as strep testing was negative and your exam is unremarkable.  Increase your fluid intake.  Salt-water gargles may be beneficial.  OTC pain medications to help with pain. Place a humidifier in the bedroom.  For back -- apply topical aspercreme to back daily.  Take Mobic once daily with food.  TAke Robaxin at bedtime to help relieve muscle tension.  Follow-up with Dr. Larose Kells in 1 month.
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
## 2014-03-14 NOTE — Assessment & Plan Note (Signed)
Rx Meloxicam daily x 2 weeks. Robaxin at bedtime. Topical Aspercreme.  Follow-up with PCP in 1 month.

## 2014-03-14 NOTE — Progress Notes (Signed)
Pre visit review using our clinic review tool, if applicable. No additional management support is needed unless otherwise documented below in the visit note/SLS  

## 2014-03-14 NOTE — Assessment & Plan Note (Signed)
Rapid strep negative.  Exam unremarkable.  Suspect viral etiology.  Supportive measures discussed. Tylenol or Ibuprofen for pain.  Salt-water gargles may also be beneficial.  Humidifier in bedroom.  Follow-up PRN.

## 2014-03-21 ENCOUNTER — Other Ambulatory Visit: Payer: Self-pay

## 2014-04-27 ENCOUNTER — Ambulatory Visit (INDEPENDENT_AMBULATORY_CARE_PROVIDER_SITE_OTHER): Payer: 59 | Admitting: Internal Medicine

## 2014-04-27 ENCOUNTER — Encounter: Payer: Self-pay | Admitting: Internal Medicine

## 2014-04-27 VITALS — BP 151/100 | HR 64 | Temp 98.2°F | Ht 65.0 in | Wt 191.2 lb

## 2014-04-27 DIAGNOSIS — M545 Low back pain, unspecified: Secondary | ICD-10-CM

## 2014-04-27 DIAGNOSIS — IMO0001 Reserved for inherently not codable concepts without codable children: Secondary | ICD-10-CM

## 2014-04-27 DIAGNOSIS — R030 Elevated blood-pressure reading, without diagnosis of hypertension: Secondary | ICD-10-CM

## 2014-04-27 DIAGNOSIS — M47814 Spondylosis without myelopathy or radiculopathy, thoracic region: Secondary | ICD-10-CM

## 2014-04-27 MED ORDER — METHOCARBAMOL 500 MG PO TABS: 500.0000 mg | ORAL_TABLET | Freq: Every evening | ORAL | Status: DC | PRN

## 2014-04-27 NOTE — Assessment & Plan Note (Signed)
Elevated BP few weeks ago and again today. Recommend increase physical activity, check BPs, see instructions. F/u 6 weeks, reasses BP then
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
## 2014-04-27 NOTE — Progress Notes (Signed)
Pre visit review using our clinic review tool, if applicable. No additional management support is needed unless otherwise documented below in the visit note. 

## 2014-04-27 NOTE — Patient Instructions (Signed)
will refer  you to physical therapy Continue taking Robaxin at bedtime Take meloxicam only if moderate back pain  Check the  blood pressure 2 or 3 times a   week Be sure your blood pressure is between 110/65 and  145/85.  if it is consistently higher or lower, let me know   Schedule a visit to see me in 6 weeks for a physical exam, bring a record of your blood pressures Come back fasting.
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
## 2014-05-02 ENCOUNTER — Ambulatory Visit: Payer: 59 | Attending: Internal Medicine

## 2014-05-02 DIAGNOSIS — R293 Abnormal posture: Secondary | ICD-10-CM | POA: Insufficient documentation

## 2014-05-02 DIAGNOSIS — M545 Low back pain: Secondary | ICD-10-CM | POA: Diagnosis present

## 2014-05-02 DIAGNOSIS — M546 Pain in thoracic spine: Secondary | ICD-10-CM | POA: Insufficient documentation

## 2014-05-04 ENCOUNTER — Ambulatory Visit: Payer: 59 | Admitting: Rehabilitation

## 2014-05-04 DIAGNOSIS — M545 Low back pain: Secondary | ICD-10-CM | POA: Diagnosis not present

## 2014-05-10 ENCOUNTER — Ambulatory Visit: Payer: 59 | Admitting: Rehabilitation

## 2014-05-16 ENCOUNTER — Ambulatory Visit: Payer: 59 | Attending: Internal Medicine | Admitting: Rehabilitation

## 2014-05-16 DIAGNOSIS — M546 Pain in thoracic spine: Secondary | ICD-10-CM | POA: Insufficient documentation

## 2014-05-16 DIAGNOSIS — M545 Low back pain: Secondary | ICD-10-CM | POA: Diagnosis not present

## 2014-05-16 DIAGNOSIS — R293 Abnormal posture: Secondary | ICD-10-CM | POA: Diagnosis not present

## 2014-05-19 ENCOUNTER — Ambulatory Visit: Payer: 59 | Admitting: Rehabilitation

## 2014-05-19 DIAGNOSIS — M545 Low back pain: Secondary | ICD-10-CM | POA: Diagnosis not present

## 2014-05-23 ENCOUNTER — Ambulatory Visit: Payer: 59 | Admitting: Rehabilitation

## 2014-05-23 DIAGNOSIS — M545 Low back pain: Secondary | ICD-10-CM | POA: Diagnosis not present

## 2014-05-24 ENCOUNTER — Ambulatory Visit: Payer: 59

## 2014-05-24 DIAGNOSIS — M545 Low back pain: Secondary | ICD-10-CM | POA: Diagnosis not present

## 2014-05-30 ENCOUNTER — Ambulatory Visit: Payer: 59 | Admitting: Rehabilitation

## 2014-06-01 ENCOUNTER — Ambulatory Visit: Payer: 59

## 2014-06-01 DIAGNOSIS — M545 Low back pain, unspecified: Secondary | ICD-10-CM
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
## 2014-06-06 ENCOUNTER — Ambulatory Visit: Payer: 59 | Admitting: Rehabilitation

## 2014-06-07 ENCOUNTER — Encounter: Payer: 59 | Admitting: Rehabilitation

## 2014-06-08 ENCOUNTER — Ambulatory Visit: Payer: 59

## 2014-06-08 DIAGNOSIS — M545 Low back pain, unspecified: Secondary | ICD-10-CM
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
## 2014-06-15 ENCOUNTER — Ambulatory Visit: Payer: 59 | Attending: Internal Medicine

## 2014-06-15 DIAGNOSIS — M546 Pain in thoracic spine: Secondary | ICD-10-CM | POA: Diagnosis not present

## 2014-06-15 DIAGNOSIS — R293 Abnormal posture: Secondary | ICD-10-CM | POA: Diagnosis not present

## 2014-06-15 DIAGNOSIS — M545 Low back pain, unspecified: Secondary | ICD-10-CM
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
## 2014-06-15 NOTE — Patient Instructions (Signed)
Discussed importance of flexibility due to tight hip musculature increasing lumbopelvic mobility/stress.  Discussed yoga class at gym and to be sure not to do any poses that increase pain.  States there is a PT available for hire at his gym for specific exercise prescription with gym equipment.  Plans to use that resources as well going forward.
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
## 2014-07-01 ENCOUNTER — Encounter: Payer: Self-pay | Admitting: Internal Medicine

## 2014-07-01 ENCOUNTER — Ambulatory Visit (INDEPENDENT_AMBULATORY_CARE_PROVIDER_SITE_OTHER): Payer: 59 | Admitting: Internal Medicine

## 2014-07-01 VITALS — BP 124/74 | HR 71 | Temp 97.7°F | Ht 65.0 in | Wt 180.2 lb

## 2014-07-01 DIAGNOSIS — Z0000 Encounter for general adult medical examination without abnormal findings: Secondary | ICD-10-CM | POA: Diagnosis not present

## 2014-07-01 LAB — COMPREHENSIVE METABOLIC PANEL
ALBUMIN: 4.9 g/dL (ref 3.5–5.2)
ALK PHOS: 65 U/L (ref 39–117)
ALT: 37 U/L (ref 0–53)
AST: 21 U/L (ref 0–37)
BUN: 15 mg/dL (ref 6–23)
CALCIUM: 9.7 mg/dL (ref 8.4–10.5)
CO2: 25 mEq/L (ref 19–32)
Chloride: 106 mEq/L (ref 96–112)
Creat: 0.74 mg/dL (ref 0.50–1.35)
Glucose, Bld: 80 mg/dL (ref 70–99)
POTASSIUM: 4.3 meq/L (ref 3.5–5.3)
Sodium: 141 mEq/L (ref 135–145)
Total Bilirubin: 0.6 mg/dL (ref 0.2–1.2)
Total Protein: 7.2 g/dL (ref 6.0–8.3)

## 2014-07-01 LAB — LIPID PANEL
Cholesterol: 234 mg/dL — ABNORMAL HIGH (ref 0–200)
HDL: 50 mg/dL (ref >=40)
LDL CALC: 161 mg/dL — AB (ref 0–99)
Total CHOL/HDL Ratio: 4.7 Ratio
Triglycerides: 114 mg/dL (ref <150)
VLDL: 23 mg/dL (ref 0–40)

## 2014-07-01 NOTE — Patient Instructions (Signed)
Get your blood work before you leave   Check the  blood pressure 2 or 3 times a month   Be sure your blood pressure is between 110/65 and  145/85.  if it is consistently higher or lower, let me know    Come back to the office in 1 year   for a physical exam  Please schedule an appointment at the front desk    Come back fasting        Testicular Self-Exam A self-examination of your testicles involves looking at and feeling your testicles for abnormal lumps or swelling. Several things can cause swelling, lumps, or pain in your testicles. Some of these causes are:  Injuries.  Inflammation.  Infection.  Accumulation of fluids around your testicle (hydrocele).  Twisted testicles (testicular torsion).  Testicular cancer. Self-examination of the testicles and groin areas may be advised if you are at risk for testicular cancer. Risks for testicular cancer include:  An undescended testicle (cryptorchidism).  A history of previous testicular cancer.  A family history of testicular cancer. The testicles are easiest to examine after warm baths or showers and are more difficult to examine when you are cold. This is because the muscles attached to the testicles retract and pull them up higher or into the abdomen. Follow these steps while you are standing:  Hold your penis away from your body.  Roll one testicle between your thumb and forefinger, feeling the entire testicle.  Roll the other testicle between your thumb and forefinger, feeling the entire testicle. Feel for lumps, swelling, or discomfort. A normal testicle is egg shaped and feels firm. It is smooth and not tender. The spermatic cord can be felt as a firm spaghetti-like cord at the back of your testicle. It is also important to examine the crease between the front of your leg and your abdomen. Feel for any bumps that are tender. These could be enlarged lymph nodes.  Document Released: 07/08/2000 Document Revised: 12/02/2012  Document Reviewed: 09/21/2012 [facility] Patient Information 2015 [place], [place]. This information is not intended to replace advice given to you by your health care provider. Make sure you discuss any questions you have with your health care provider.

## 2014-07-01 NOTE — Assessment & Plan Note (Signed)
Td per pt ~ 08-2010 Diet-exercise discussed STE Labs +FH CAD: asx    Other issues, Elevated BP? BP today is normal, recommend to monitor BPs from time to time Back pain, status post physical therapy, improving, encouraged to continue doing exercises to prevent back pain

## 2014-07-01 NOTE — Progress Notes (Signed)
Pre visit review using our clinic review tool, if applicable. No additional management support is needed unless otherwise documented below in the visit note. 

## 2014-07-01 NOTE — Progress Notes (Signed)
Subjective:    Patient ID: Troy [name], male    DOB: 1970-12-24, 44 y.o.   MRN: [id number]  DOS:  07/01/2014 Type of visit - description : cpx Interval history:  Was previously seen with back pain, doing better, in general feeling very good.  Review of Systems Constitutional: No fever, chills. No unexplained wt changes. No unusual sweats HEENT: No dental problems, ear discharge, facial swelling, voice changes. No eye discharge, redness or intolerance to light Respiratory: No wheezing or difficulty breathing. No cough , mucus production Cardiovascular: No CP, leg swelling or palpitations GI: no nausea, vomiting, diarrhea or abdominal pain.  No blood in the stools. No dysphagia   Endocrine: No polyphagia, polyuria or polydipsia GU: No dysuria, gross hematuria, difficulty urinating. No urinary urgency or frequency. Musculoskeletal: No joint swellings or unusual aches or pains Skin: No change in the color of the skin, palor or rash Allergic, immunologic: No environmental allergies or food allergies Neurological: No dizziness or syncope. No headaches. No diplopia, slurred speech, motor deficits, facial numbness Hematological: No enlarged lymph nodes, easy bruising or bleeding Psychiatry: No suicidal ideas, hallucinations, behavior problems or confusion. No unusual/severe anxiety or depression.     Past Medical History  Diagnosis Date  . Unspecified essential hypertension   . 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 Orif radial fracture Left 07/25/2012    Procedure: OPEN REDUCTION INTERNAL FIXATION (ORIF) Distal RADIus FRACTURE;  Surgeon: Dennie Bible, MD;  Location: [facility];  Service: Plastics;  Laterality: Left;    History   Social History  . Marital Status: Married    Spouse Name: N/A  . Number of Children: 2  . 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 Smoking status: Never Smoker   . Smokeless tobacco: Never Used  . Alcohol Use: Yes     Comment: socially   . Drug Use: No  . Sexual Activity: Not on file   Other Topics Concern  . Not on file   Social History Narrative   Original from [place] but raised in the [place]   NO REGULAR EXERCISE           Family History  Problem Relation Age of Onset  . Coronary artery disease Maternal Uncle     uncle in their 47s  . Diabetes Neg Hx   . Colon cancer Neg Hx   . Hyperlipidemia Mother     mother   . Heart disease Mother     MI  . Heart attack Maternal Uncle   . Breast cancer Mother   . Prostate cancer Neg Hx        Medication List       This list is accurate as of: 07/01/14  3:46 PM.  Always use your most recent med list.               meloxicam 15 MG tablet  Commonly known as:  MOBIC  Take 1 tablet (15 mg total) by mouth daily.     methocarbamol 500 MG tablet  Commonly known as:  ROBAXIN  Take 1 tablet (500 mg total) by mouth at bedtime as needed for muscle spasms.           Objective:   Physical Exam BP 124/74 mmHg  Pulse 71  Temp(Src) 97.7 F (36.5 C) (  Oral)  Ht 5\' 5"  (1.651 m)  Wt 180 lb 4 oz (81.761 kg)  BMI 30.00 kg/m2  SpO2 99%  General:   Well developed, well nourished . NAD.  Neck:  Full range of motion. Supple. No  thyromegaly , normal carotid pulse HEENT:  Normocephalic . Face symmetric, atraumatic Lungs:  CTA B Normal respiratory effort, no intercostal retractions, no accessory muscle use. Heart: RRR,  no murmur.  Abdomen:  Not distended, soft, non-tender. No rebound or rigidity. No mass,organomegaly Muscle skeletal: no pretibial edema bilaterally  Skin: Exposed areas without rash. Not pale. Not jaundice Neurologic:  alert & oriented X3.  Speech normal, gait appropriate for age and unassisted Strength symmetric and appropriate for age.  Psych: Cognition and judgment appear intact.  Cooperative with normal  attention span and concentration.  Behavior appropriate. No anxious or depressed appearing.       Assessment & Plan:

## 2014-07-02 LAB — TSH: TSH: 1.01 u[IU]/mL (ref 0.350–4.500)

## 2014-07-12 ENCOUNTER — Other Ambulatory Visit: Payer: Self-pay | Admitting: General Surgery

## 2014-07-12 DIAGNOSIS — T148XXA Other injury of unspecified body region, initial encounter: Secondary | ICD-10-CM

## 2014-07-13 ENCOUNTER — Ambulatory Visit
Admission: RE | Admit: 2014-07-13 | Discharge: 2014-07-13 | Disposition: A | Discharge status: Home / Self Care | Payer: No Typology Code available for payment source | Source: Ambulatory Visit | Attending: General Surgery | Admitting: General Surgery

## 2014-07-13 DIAGNOSIS — T148XXA Other injury of unspecified body region, initial encounter: Secondary | ICD-10-CM

## 2014-11-28 ENCOUNTER — Encounter: Payer: Self-pay | Admitting: Internal Medicine

## 2014-11-28 ENCOUNTER — Ambulatory Visit (INDEPENDENT_AMBULATORY_CARE_PROVIDER_SITE_OTHER): Payer: 59 | Admitting: Internal Medicine

## 2014-11-28 VITALS — BP 124/66 | HR 66 | Temp 98.0°F | Ht 65.0 in | Wt 186.1 lb

## 2014-11-28 DIAGNOSIS — D179 Benign lipomatous neoplasm, unspecified: Secondary | ICD-10-CM | POA: Diagnosis not present

## 2014-11-28 NOTE — Progress Notes (Signed)
Pre visit review using our clinic review tool, if applicable. No additional management support is needed unless otherwise documented below in the visit note. 

## 2014-11-28 NOTE — Patient Instructions (Signed)
Lipoma  A lipoma is a noncancerous (benign) tumor composed of fat cells. They are usually found under the skin (subcutaneous). A lipoma may occur in any tissue of the body that contains fat. Common areas for lipomas to appear include the back, shoulders, buttocks, and thighs. Lipomas are a very common soft tissue growth. They are soft and grow slowly. Most problems caused by a lipoma depend on where it is growing.  DIAGNOSIS   A lipoma can be diagnosed with a physical exam. These tumors rarely become cancerous, but radiographic studies can help determine this for certain. Studies used may include:  · Computerized X-ray scans (CT or CAT scan).  · Computerized magnetic scans (MRI).  TREATMENT   Small lipomas that are not causing problems may be watched. If a lipoma continues to enlarge or causes problems, removal is often the best treatment. Lipomas can also be removed to improve appearance. Surgery is done to remove the fatty cells and the surrounding capsule. Most often, this is done with medicine that numbs the area (local anesthetic). The removed tissue is examined under a microscope to make sure it is not cancerous. Keep all follow-up appointments with your caregiver.  SEEK MEDICAL CARE IF:   · The lipoma becomes larger or hard.  · The lipoma becomes painful, red, or increasingly swollen. These could be signs of infection or a more serious condition.  Document Released: 03/22/2002 Document Revised: 06/24/2011 Document Reviewed: 09/01/2009  Exit[employer]® Patient Information ©2015 [employer]. This information is not intended to replace advice given to you by your health care provider. Make sure you discuss any questions you have with your health care provider.

## 2014-11-28 NOTE — Progress Notes (Signed)
   Subjective:    Patient ID: Troy Pizza., male    DOB: Aug 16, 1970, 44 y.o.   MRN: [id number]  DOS:  11/28/2014 Type of visit - description : Acute visit Interval history: A few days ago noted a lump at the chest, area is essentially asymptomatic: No pain, redness, discharge. Additionally he feels well, denies fever, chills or weight loss.    Review of Systems   Past Medical History  Diagnosis Date  . Unspecified essential hypertension   . 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 Orif radial fracture Left 07/25/2012    Procedure: OPEN REDUCTION INTERNAL FIXATION (ORIF) Distal RADIus FRACTURE;  Surgeon: Dennie Bible, MD;  Location: [facility];  Service: Plastics;  Laterality: Left;    Social History   Social History  . Marital Status: Married    Spouse Name: N/A  . Number of Children: 2  . 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 Smoking status: Never Smoker   . Smokeless tobacco: Never Used  . Alcohol Use: Yes     Comment: socially   . Drug Use: No  . Sexual Activity: Not on file   Other Topics Concern  . Not on file   Social History Narrative   Original from [place] but raised in the [place]       Household-- pt , wife, 2 children (87 male , 42 y/o male)              Medication List       This list is accurate as of: 11/28/14 11:59 PM.  Always use your most recent med list.               methocarbamol 500 MG tablet  Commonly known as:  ROBAXIN  Take 1 tablet (500 mg total) by mouth at bedtime as needed for muscle spasms.           Objective:   Physical Exam  Musculoskeletal:       Arms:  BP 124/66 mmHg  Pulse 66  Temp(Src) 98 F (36.7 C) (Oral)  Ht 5\' 5"  (1.651 m)  Wt 186 lb 2 oz (84.426 kg)  BMI 30.97 kg/m2  SpO2 95% General:   Well developed, well nourished . NAD.  HEENT:  Normocephalic .  Face symmetric, atraumatic  Skin: Not pale. Not jaundice Neurologic:  alert & oriented X3.  Speech normal, gait appropriate for age and unassisted Psych--  Cognition and judgment appear intact.  Cooperative with normal attention span and concentration.  Behavior appropriate. No anxious or depressed appearing.      Assessment & Plan:

## 2014-11-28 NOTE — Assessment & Plan Note (Signed)
Findings consistent with lipoma, discussed the diagnosis with the patient, explained that the only way we can be 100% of what it is is by doing a biopsy or alternatively a ultrasound. Discuss a referral to sports medicine for ultrasound versus a surgical referral.  At this point we agreed on observation, he will perform self palpation and notify of any changes or otherwise will recheck the area in few months when he comes back for a physical.

## 2015-03-23 ENCOUNTER — Telehealth: Payer: Self-pay | Admitting: Internal Medicine

## 2015-05-10 NOTE — Telephone Encounter (Signed)
flu

## 2015-07-03 ENCOUNTER — Encounter: Payer: No Typology Code available for payment source | Admitting: Internal Medicine

## 2015-09-22 ENCOUNTER — Encounter: Payer: Self-pay | Admitting: Internal Medicine

## 2015-10-02 ENCOUNTER — Encounter: Payer: Self-pay | Admitting: Internal Medicine

## 2015-10-03 ENCOUNTER — Telehealth: Payer: Self-pay | Admitting: Internal Medicine

## 2015-10-03 NOTE — Telephone Encounter (Signed)
No Show 6/19. Do not see any previous No Shows. Charge or No Charge?

## 2015-10-04 ENCOUNTER — Encounter: Payer: Self-pay | Admitting: Internal Medicine

## 2015-10-04 NOTE — Telephone Encounter (Signed)
No, thx 

## 2016-04-19 ENCOUNTER — Encounter: Payer: Self-pay | Admitting: Internal Medicine

## 2016-04-19 ENCOUNTER — Ambulatory Visit (INDEPENDENT_AMBULATORY_CARE_PROVIDER_SITE_OTHER): Payer: 59 | Admitting: Internal Medicine

## 2016-04-19 VITALS — BP 124/80 | HR 69 | Temp 98.1°F | Resp 14 | Ht 65.0 in | Wt 192.1 lb

## 2016-04-19 DIAGNOSIS — D1739 Benign lipomatous neoplasm of skin and subcutaneous tissue of other sites: Secondary | ICD-10-CM | POA: Diagnosis not present

## 2016-04-19 DIAGNOSIS — R030 Elevated blood-pressure reading, without diagnosis of hypertension: Secondary | ICD-10-CM

## 2016-04-19 DIAGNOSIS — D171 Benign lipomatous neoplasm of skin and subcutaneous tissue of trunk: Secondary | ICD-10-CM

## 2016-04-19 NOTE — Progress Notes (Signed)
Pre visit review using our clinic review tool, if applicable. No additional management support is needed unless otherwise documented below in the visit note. 

## 2016-04-19 NOTE — Patient Instructions (Signed)
GO TO THE FRONT DESK Schedule your next appointment for a  Physical at your convenience    For back pain  IBUPROFEN (Advil or Motrin) 200 mg 2 tablets every 6 hours as needed for pain.  Always take it with food because may cause gastritis and ulcers.  If you notice nausea, stomach pain, change in the color of stools --->  Stop the medicine and let us know   [place] with information about home physical therapy for back pain: [email or web address]

## 2016-04-19 NOTE — Progress Notes (Signed)
Subjective:    Patient ID: Troy [name]., male    DOB: 1970/06/18, 46 y.o.   MRN: [id number]  DOS:  04/19/2016 Type of visit - description : acute Interval history: Few weeks ago become aware of a lump at the right leg. Is not red, warm or tender. No recent injury. He continued to be somewhat concerned about lipoma on the chest. Elevated BP: No recent ambulatory BPs, BP today is very good Also, continued having occasional low back pain without radiation, usually when he lifts something.   Review of Systems No fever chills  Past Medical History:  Diagnosis Date  . Lipoma of other skin and subcutaneous tissue    left hip  . Unspecified essential hypertension     Past Surgical History:  Procedure Laterality Date  . ORIF RADIAL FRACTURE Left 07/25/2012   Procedure: OPEN REDUCTION INTERNAL FIXATION (ORIF) Distal RADIus FRACTURE;  Surgeon: Dennie Bible, MD;  Location: [name];  Service: Plastics;  Laterality: Left;    Social History   Social History  . Marital status: Married    Spouse name: N/A  . Number of children: 2  . 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 Smoking status: Never Smoker  . Smokeless tobacco: Never Used  . Alcohol use Yes     Comment: socially   . Drug use: No  . Sexual activity: Not on file   Other Topics Concern  . Not on file   Social History Narrative   Original from [place] but raised in the [place]       Household-- pt , wife, 2 children (47 male , 45 y/o male)            Allergies as of 04/19/2016   No Known Allergies     Medication List       Accurate as of 04/19/16 10:51 AM. Always use your most recent med list.          methocarbamol 500 MG tablet Commonly known as:  ROBAXIN Take 1 tablet (500 mg total) by mouth at bedtime as needed for muscle spasms.          Objective:   Physical Exam  Skin:      BP 124/80 (BP Location: Left Arm, Patient  Position: Sitting, Cuff Size: Normal)   Pulse 69   Temp 98.1 F (36.7 C) (Oral)   Resp 14   Ht 5\' 5"  (1.651 m)   Wt 192 lb 2 oz (87.1 kg)   SpO2 99%   BMI 31.97 kg/m  General:   Well developed, well nourished . NAD.  HEENT:  Normocephalic . Face symmetric, atraumatic Lungs:  CTA B Normal respiratory effort, no intercostal retractions, no accessory muscle use. Heart: RRR,  no murmur.  No pretibial edema bilaterally  Skin: Not pale. Not jaundice Neurologic:  alert & oriented X3.  Speech normal, gait appropriate for age and unassisted Psych--  Cognition and judgment appear intact.  Cooperative with normal attention span and concentration.  Behavior appropriate. No anxious or depressed appearing.      Assessment & Plan:   Assessment Elevated BP Lipomas + Hemoccult, colonoscopy 08/2012: tics, polyps; next 08-2017  PLAN: Lipomas: Has 2 masses -- R  right chest and R leg consistent with lipomas. Compared to a few years ago, the one of the chest has increased in size from 321 cm to 531.5 cm. Both have benign features.  Discussed possibly referral to surgery since the chest mass has increased in size --->  will arrange. Elevated BP: BP today is excellent Back pain: For many years, no formal exam was done today but I recommended Motrin as needed and self PT. Call if no better Recommend to RTC for a CPX at his convenience.

## 2016-04-20 DIAGNOSIS — Z09 Encounter for follow-up examination after completed treatment for conditions other than malignant neoplasm: Secondary | ICD-10-CM | POA: Insufficient documentation

## 2016-04-20 NOTE — Assessment & Plan Note (Signed)
Lipomas: Has 2 masses -- R  right chest and R leg consistent with lipomas. Compared to a few years ago, the one of the chest has increased in size from 321 cm to 531.5 cm. Both have benign features. Discussed possibly referral to surgery since the chest mass has increased in size --->  will arrange. Elevated BP: BP today is excellent Back pain: For many years, no formal exam was done today but I recommended Motrin as needed and self PT. Call if no better Recommend to RTC for a CPX at his convenience.

## 2016-05-14 ENCOUNTER — Other Ambulatory Visit: Payer: Self-pay | Admitting: Surgery

## 2017-08-28 ENCOUNTER — Encounter: Payer: Self-pay | Admitting: Gastroenterology

## 2018-11-10 ENCOUNTER — Encounter: Payer: Self-pay | Admitting: Internal Medicine

## 2021-12-25 ENCOUNTER — Telehealth: Payer: Self-pay | Admitting: Internal Medicine

## 2021-12-25 NOTE — Telephone Encounter (Signed)
Please advise 

## 2021-12-25 NOTE — Telephone Encounter (Signed)
Patient called to see if he could be reestablished with Dr. Larose Kells. Please advise.

## 2021-12-25 NOTE — Telephone Encounter (Signed)
That is okay thank you

## 2021-12-25 NOTE — Telephone Encounter (Signed)
Okay to re-establish care.

## 2021-12-27 ENCOUNTER — Telehealth: Payer: Self-pay | Admitting: Internal Medicine

## 2021-12-27 NOTE — Telephone Encounter (Signed)
LVM advising that Dr. Larose Kells has approved for him to reestablish care. Advised him to call back and schedule the appointment.

## 2022-01-17 ENCOUNTER — Encounter: Payer: Self-pay | Admitting: Internal Medicine

## 2022-01-17 ENCOUNTER — Ambulatory Visit: Payer: BC Managed Care – PPO | Admitting: Internal Medicine

## 2022-01-17 VITALS — BP 144/90 | HR 68 | Temp 98.5°F | Resp 16 | Ht 65.0 in | Wt 192.2 lb

## 2022-01-17 DIAGNOSIS — I10 Essential (primary) hypertension: Secondary | ICD-10-CM | POA: Diagnosis not present

## 2022-01-17 DIAGNOSIS — Z09 Encounter for follow-up examination after completed treatment for conditions other than malignant neoplasm: Secondary | ICD-10-CM | POA: Diagnosis not present

## 2022-01-17 DIAGNOSIS — R739 Hyperglycemia, unspecified: Secondary | ICD-10-CM | POA: Diagnosis not present

## 2022-01-17 LAB — LIPID PANEL
Cholesterol: 214 mg/dL — ABNORMAL HIGH (ref 0–200)
HDL: 52.2 mg/dL (ref >39.00)
LDL Cholesterol: 140 mg/dL — ABNORMAL HIGH (ref 0–99)
NonHDL: 161.45
Total CHOL/HDL Ratio: 4
Triglycerides: 107 mg/dL (ref 0.0–149.0)
VLDL: 21.4 mg/dL (ref 0.0–40.0)

## 2022-01-17 LAB — TSH: TSH: 1.06 u[IU]/mL (ref 0.35–5.50)

## 2022-01-17 LAB — MAGNESIUM: Magnesium: 2.1 mg/dL (ref 1.5–2.5)

## 2022-01-17 LAB — HEMOGLOBIN A1C: Hgb A1c MFr Bld: 5.8 % (ref 4.6–6.5)

## 2022-01-17 MED ORDER — AMLODIPINE BESYLATE 5 MG PO TABS: 5.0000 mg | ORAL_TABLET | Freq: Every day | ORAL | 0 refills | Status: DC

## 2022-01-17 NOTE — Assessment & Plan Note (Signed)
New patient, to reestablish HTN: New onset of hypertension, previously BP was modestly elevated.  Has been asymptomatic all along. Started amlodipine few days ago, ambulatory BPs gradually getting better from the 180s to the 140s. 12-25-2021 at the ER: BMP normal, CBC normal EKG today: Normal Plan: Continue amlodipine, anticipate BP will continue to improve.  Goal is in the 120s, if he is not at goal he will call in 2 weeks. Check a FLP, TSH and A1c. Extensive discussion about healthy diet particularly low-salt diet. Preventive care: Declined a flu shot, encouraged COVID-vaccine RTC 2 months CPX.

## 2022-01-17 NOTE — Patient Instructions (Addendum)
Continue amlodipine Continue improving your diet, watch her salt intake, see information below  Check the  blood pressure regularly BP GOAL is between 110/65 and  135/85. Call in 2 weeks if your blood pressure is not in the 120s   Vaccines I recommend:  COVID booster    GO TO THE LAB : Get the blood work     [place], [place] back for a physical exam in 2 months, fasting   LOW SALT DIET   DASH stands for Dietary Approaches to Stop Hypertension. The DASH eating plan is a healthy eating plan that has been shown to: Reduce high blood pressure (hypertension). Reduce your risk for type 2 diabetes, heart disease, and stroke. Help with weight loss. What are tips for following this plan? Reading food labels Check food labels for the amount of salt (sodium) per serving. Choose foods with less than 5 percent of the Daily Value of sodium. Generally, foods with less than 300 milligrams (mg) of sodium per serving fit into this eating plan. To find whole grains, look for the word "whole" as the first word in the ingredient list. Shopping Buy products labeled as "low-sodium" or "no salt added." Buy fresh foods. Avoid canned foods and pre-made or frozen meals. Cooking Avoid adding salt when cooking. Use salt-free seasonings or herbs instead of table salt or sea salt. Check with your health care provider or pharmacist before using salt substitutes. Do not fry foods. Cook foods using healthy methods such as baking, boiling, grilling, roasting, and broiling instead. Cook with heart-healthy oils, such as olive, canola, avocado, soybean, or sunflower oil. Meal planning  Eat a balanced diet that includes: 4 or more servings of fruits and 4 or more servings of vegetables each day. Try to fill one-half of your plate with fruits and vegetables. 6-8 servings of whole grains each day. Less than 6 oz (170 g) of lean meat, poultry, or fish each day. A 3-oz  (85-g) serving of meat is about the same size as a deck of cards. One egg equals 1 oz (28 g). 2-3 servings of low-fat dairy each day. One serving is 1 cup (237 mL). 1 serving of nuts, seeds, or beans 5 times each week. 2-3 servings of heart-healthy fats. Healthy fats called omega-3 fatty acids are found in foods such as walnuts, flaxseeds, fortified milks, and eggs. These fats are also found in cold-water fish, such as sardines, salmon, and mackerel. Limit how much you eat of: Canned or prepackaged foods. Food that is high in trans fat, such as some fried foods. Food that is high in saturated fat, such as fatty meat. Desserts and other sweets, sugary drinks, and other foods with added sugar. Full-fat dairy products. Do not salt foods before eating. Do not eat more than 4 egg yolks a week. Try to eat at least 2 vegetarian meals a week. Eat more home-cooked food and less restaurant, buffet, and fast food. Lifestyle When eating at a restaurant, ask that your food be prepared with less salt or no salt, if possible. If you drink alcohol: Limit how much you use to: 0-1 drink a day for women who are not pregnant. 0-2 drinks a day for men. Be aware of how much alcohol is in your drink. In the [place] one drink equals one 12 oz bottle of beer (355 mL), one 5 oz glass of wine (148 mL), or one 1 oz glass of hard liquor (44 mL). General information  Avoid eating more than 2,300 mg of salt a day. If you have hypertension, you may need to reduce your sodium intake to 1,500 mg a day. Work with your health care provider to maintain a healthy body weight or to lose weight. Ask what an ideal weight is for you. Get at least 30 minutes of exercise that causes your heart to beat faster (aerobic exercise) most days of the week. Activities may include walking, swimming, or biking. Work with your health care provider or dietitian to adjust your eating plan to your individual calorie needs. What foods should I  eat? Fruits All fresh, dried, or frozen fruit. Canned fruit in natural juice (without added sugar). Vegetables Fresh or frozen vegetables (raw, steamed, roasted, or grilled). Low-sodium or reduced-sodium tomato and vegetable juice. Low-sodium or reduced-sodium tomato sauce and tomato paste. Low-sodium or reduced-sodium canned vegetables. Grains Whole-grain or whole-wheat bread. Whole-grain or whole-wheat pasta. Brown rice. [place]. Bulgur. Whole-grain and low-sodium cereals. Pita bread. Low-fat, low-sodium crackers. Whole-wheat flour tortillas. Meats and other proteins Skinless chicken or [place]. Ground chicken or [place]. Pork with fat trimmed off. Fish and seafood. Egg whites. Dried beans, peas, or lentils. Unsalted nuts, nut butters, and seeds. Unsalted canned beans. Lean cuts of beef with fat trimmed off. Low-sodium, lean precooked or cured meat, such as sausages or meat loaves. Dairy Low-fat (1%) or fat-free (skim) milk. Reduced-fat, low-fat, or fat-free cheeses. Nonfat, low-sodium ricotta or cottage cheese. Low-fat or nonfat yogurt. Low-fat, low-sodium cheese. Fats and oils Soft margarine without trans fats. Vegetable oil. Reduced-fat, low-fat, or light mayonnaise and salad dressings (reduced-sodium). Canola, safflower, olive, avocado, soybean, and sunflower oils. Avocado. Seasonings and condiments Herbs. Spices. Seasoning mixes without salt. Other foods Unsalted popcorn and pretzels. Fat-free sweets. The items listed above may not be a complete list of foods and beverages you can eat. Contact a dietitian for more information. What foods should I avoid? Fruits Canned fruit in a light or heavy syrup. Fried fruit. Fruit in cream or butter sauce. Vegetables Creamed or fried vegetables. Vegetables in a cheese sauce. Regular canned vegetables (not low-sodium or reduced-sodium). Regular canned tomato sauce and paste (not low-sodium or reduced-sodium). Regular tomato and vegetable juice  (not low-sodium or reduced-sodium). Angie Fava. Olives. Grains Baked goods made with fat, such as croissants, muffins, or some breads. Dry pasta or rice meal packs. Meats and other proteins Fatty cuts of meat. Ribs. Fried meat. Berniece Salines. Bologna, salami, and other precooked or cured meats, such as sausages or meat loaves. Fat from the back of a pig (fatback). Bratwurst. Salted nuts and seeds. Canned beans with added salt. Canned or smoked fish. Whole eggs or egg yolks. Chicken or [place] with skin. Dairy Whole or 2% milk, cream, and half-and-half. Whole or full-fat cream cheese. Whole-fat or sweetened yogurt. Full-fat cheese. Nondairy creamers. Whipped toppings. Processed cheese and cheese spreads. Fats and oils Butter. Stick margarine. Lard. Shortening. Ghee. Bacon fat. Tropical oils, such as coconut, palm kernel, or palm oil. Seasonings and condiments Onion salt, garlic salt, seasoned salt, table salt, and sea salt. Worcestershire sauce. Tartar sauce. Barbecue sauce. Teriyaki sauce. Soy sauce, including reduced-sodium. Steak sauce. Canned and packaged gravies. Fish sauce. Oyster sauce. Cocktail sauce. Store-bought horseradish. Ketchup. Mustard. Meat flavorings and tenderizers. Bouillon cubes. Hot sauces. Pre-made or packaged marinades. Pre-made or packaged taco seasonings. Relishes. Regular salad dressings. Other foods Salted popcorn and pretzels. The items listed above may not be a complete list of foods and beverages you should avoid. Contact a dietitian for  more information. Where to find more information National Heart, Lung, and Blood Institute: https://wilson-eaton.com/ American Heart Association: www.heart.org Academy of Nutrition and Dietetics: www.eatright.[place]: www.kidney.org Summary The DASH eating plan is a healthy eating plan that has been shown to reduce high blood pressure (hypertension). It may also reduce your risk for type 2 diabetes, heart disease, and  stroke. When on the DASH eating plan, aim to eat more fresh fruits and vegetables, whole grains, lean proteins, low-fat dairy, and heart-healthy fats. With the DASH eating plan, you should limit salt (sodium) intake to 2,300 mg a day. If you have hypertension, you may need to reduce your sodium intake to 1,500 mg a day. Work with your health care provider or dietitian to adjust your eating plan to your individual calorie needs. This information is not intended to replace advice given to you by your health care provider. Make sure you discuss any questions you have with your health care provider. Document Revised: 03/05/2019 Document Reviewed: 03/05/2019 Elsevier Patient Education  [place].

## 2022-01-17 NOTE — Progress Notes (Signed)
Subjective:    Patient ID: Troy [name]., male    DOB: [birth year], 51 y.o.   MRN: [id number]  DOS:  01/17/2022 Type of visit - description: To reestablish, new patient, LOV 2018  Here for hypertension eval.  Was seen at a dental office, was told BP was elevated. Subsequently went to the urgent care, reportedly systolic BP was 338. Was prescribed amlodipine. Subsequently went to the emergency room 01/04/2022, blood pressure was 152/93, heart rate 64.  Labs okay, was rec to  continue amlodipine and see PCP.  Since he was told that the BP was elevated he started to check. Initially amb systolic BP was in the 250N, it is gradually getting better since he started amlodipine.  This morning was in the 140s. He is also doing better with diet and is eating less carbohydrates.  He was completely asymptomatic, denies chest pain, difficulty breathing or lower extremity edema No headaches.   Review of Systems See above   Past Medical History:  Diagnosis Date   Lipoma of other skin and subcutaneous tissue    left hip   Unspecified essential hypertension     Past Surgical History:  Procedure Laterality Date   ORIF RADIAL FRACTURE Left 07/25/2012   Procedure: OPEN REDUCTION INTERNAL FIXATION (ORIF) Distal RADIus FRACTURE;  Surgeon: Dennie Bible, MD;  Location: [facility];  Service: Plastics;  Laterality: Left;   Social History   Socioeconomic History   Marital status: Married    Spouse name: Not on file   Number of children: 2   Years of education: Not on file   Highest education level: Not on file  Occupational History   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CARPENTER  Tobacco Use   Smoking status: Never   Smokeless tobacco: Never  Substance and Sexual Activity   Alcohol use: Yes    Alcohol/week: 4.0 standard drinks of alcohol    Types: 4 Cans of beer per week    Comment: socially    Drug use: No   Sexual activity: Yes  Other Topics Concern   Not on file  Social History Narrative    Original from [place] but raised in the [place]       Household-- pt , wife, 2 children (2000 male , 77 male)      Social Determinants of Radio broadcast assistant Strain: Not on file  Food Insecurity: Not on file  Transportation Needs: Not on file  Physical Activity: Not on file  Stress: Not on file  Social Connections: Not on file  Intimate Partner Violence: Not on file   Family History  Problem Relation Age of Onset   Hyperlipidemia Mother        mother    Heart disease Mother        MI   Breast cancer Mother    High blood pressure Brother    High blood pressure Brother    Coronary artery disease Maternal Uncle        uncle in their 64s   Heart attack Maternal Uncle    Diabetes Neg Hx    Colon cancer Neg Hx    Prostate cancer Neg Hx     Current Outpatient Medications  Medication Instructions   amLODipine (NORVASC) 5 mg, Oral, Daily   Choline & Mag Salicylates (CHOLINE-MAG TRISALICYLATE PO) Oral, +Vitamin C (supplement from [place])   co-enzyme Q-10 30 mg, Oral, Daily   Magnesium 400 MG CAPS Oral       Objective:   Physical  Exam BP (!) 144/90   Pulse 68   Temp 98.5 F (36.9 C) (Oral)   Resp 16   Ht '5\' 5"'$  (1.651 m)   Wt 192 lb 4 oz (87.2 kg)   SpO2 97%   BMI 31.99 kg/m  General:   Well developed, NAD, BMI noted.  HEENT:  Normocephalic . Face symmetric, atraumatic Lungs:  CTA B Normal respiratory effort, no intercostal retractions, no accessory muscle use. Heart: RRR,  no murmur.  Abdomen:  Not distended, soft, non-tender. No rebound or rigidity.  No bruit Skin: Not pale. Not jaundice Lower extremities: no pretibial edema bilaterally  Neurologic:  alert & oriented X3.  Speech normal, gait appropriate for age and unassisted Psych--  Cognition and judgment appear intact.  Cooperative with normal attention span and concentration.  Behavior appropriate. No anxious or depressed appearing.     Assessment     Assessment  ( reestablished, new  patient 01-2022) HTN dx 12-2021 Lipomas + Hemoccult, colonoscopy 08/2012: tics, polyps; next 08-2017   PLAN: New patient, to reestablish HTN: New onset of hypertension, previously BP was modestly elevated.  Has been asymptomatic all along. Started amlodipine few days ago, ambulatory BPs gradually getting better from the 180s to the 140s. 12-25-2021 at the ER: BMP normal, CBC normal EKG today: Normal Plan: Continue amlodipine, anticipate BP will continue to improve.  Goal is in the 120s, if he is not at goal he will call in 2 weeks. Check a FLP, TSH and A1c. Extensive discussion about healthy diet particularly low-salt diet. Preventive care: Declined a flu shot, encouraged COVID-vaccine RTC 2 months CPX.

## 2022-04-02 ENCOUNTER — Encounter: Payer: BC Managed Care – PPO | Admitting: Internal Medicine

## 2022-04-02 ENCOUNTER — Encounter: Payer: Self-pay | Admitting: Internal Medicine

## 2022-04-16 ENCOUNTER — Other Ambulatory Visit: Payer: Self-pay | Admitting: Internal Medicine

## 2022-04-18 ENCOUNTER — Other Ambulatory Visit: Payer: Self-pay | Admitting: Internal Medicine

## 2022-05-17 ENCOUNTER — Ambulatory Visit (INDEPENDENT_AMBULATORY_CARE_PROVIDER_SITE_OTHER): Payer: BC Managed Care – PPO | Admitting: Internal Medicine

## 2022-05-17 ENCOUNTER — Encounter: Payer: Self-pay | Admitting: Internal Medicine

## 2022-05-17 VITALS — BP 134/84 | HR 78 | Temp 98.2°F | Resp 16 | Ht 65.0 in | Wt 195.2 lb

## 2022-05-17 DIAGNOSIS — Z0000 Encounter for general adult medical examination without abnormal findings: Secondary | ICD-10-CM

## 2022-05-17 DIAGNOSIS — Z1159 Encounter for screening for other viral diseases: Secondary | ICD-10-CM

## 2022-05-17 DIAGNOSIS — Z23 Encounter for immunization: Secondary | ICD-10-CM | POA: Diagnosis not present

## 2022-05-17 DIAGNOSIS — Z114 Encounter for screening for human immunodeficiency virus [HIV]: Secondary | ICD-10-CM | POA: Diagnosis not present

## 2022-05-17 DIAGNOSIS — I10 Essential (primary) hypertension: Secondary | ICD-10-CM

## 2022-05-17 DIAGNOSIS — Z1211 Encounter for screening for malignant neoplasm of colon: Secondary | ICD-10-CM

## 2022-05-17 LAB — CBC WITH DIFFERENTIAL/PLATELET
Basophils Absolute: 0.1 10*3/uL (ref 0.0–0.1)
Basophils Relative: 0.8 % (ref 0.0–3.0)
Eosinophils Absolute: 0.1 10*3/uL (ref 0.0–0.7)
Eosinophils Relative: 1.7 % (ref 0.0–5.0)
HCT: 48.3 % (ref 39.0–52.0)
Hemoglobin: 16.4 g/dL (ref 13.0–17.0)
Lymphocytes Relative: 36.9 % (ref 12.0–46.0)
Lymphs Abs: 2.6 10*3/uL (ref 0.7–4.0)
MCHC: 34 g/dL (ref 30.0–36.0)
MCV: 92.3 fl (ref 78.0–100.0)
Monocytes Absolute: 0.7 10*3/uL (ref 0.1–1.0)
Monocytes Relative: 9.9 % (ref 3.0–12.0)
Neutro Abs: 3.6 10*3/uL (ref 1.4–7.7)
Neutrophils Relative %: 50.7 % (ref 43.0–77.0)
Platelets: 245 10*3/uL (ref 150.0–400.0)
RBC: 5.23 Mil/uL (ref 4.22–5.81)
RDW: 13.2 % (ref 11.5–15.5)
WBC: 7.2 10*3/uL (ref 4.0–10.5)

## 2022-05-17 LAB — COMPREHENSIVE METABOLIC PANEL
ALT: 34 U/L (ref 0–53)
AST: 20 U/L (ref 0–37)
Albumin: 4.8 g/dL (ref 3.5–5.2)
Alkaline Phosphatase: 89 U/L (ref 39–117)
BUN: 17 mg/dL (ref 6–23)
CO2: 28 mEq/L (ref 19–32)
Calcium: 9.6 mg/dL (ref 8.4–10.5)
Chloride: 103 mEq/L (ref 96–112)
Creatinine, Ser: 0.85 mg/dL (ref 0.40–1.50)
GFR: 101.01 mL/min (ref >60.00)
Glucose, Bld: 82 mg/dL (ref 70–99)
Potassium: 4.6 mEq/L (ref 3.5–5.1)
Sodium: 140 mEq/L (ref 135–145)
Total Bilirubin: 0.5 mg/dL (ref 0.2–1.2)
Total Protein: 7.4 g/dL (ref 6.0–8.3)

## 2022-05-17 LAB — PSA: PSA: 1.72 ng/mL (ref 0.10–4.00)

## 2022-05-17 MED ORDER — AMLODIPINE BESYLATE 10 MG PO TABS: 10.0000 mg | ORAL_TABLET | Freq: Every day | ORAL | 3 refills | Status: DC

## 2022-05-17 NOTE — Progress Notes (Unsigned)
Subjective:    Patient ID: Troy [name]., male    DOB: [birth year], 52 y.o.   MRN: [id number]  DOS:  05/17/2022 Type of visit - description: CPX  Since the last office visit is doing well. Started amlodipine, BPs occasionally in the 140s. No edema.   Review of Systems  A 14 point review of systems is negative    Past Medical History:  Diagnosis Date   Lipoma of other skin and subcutaneous tissue    left hip   Unspecified essential hypertension     Past Surgical History:  Procedure Laterality Date   ORIF RADIAL FRACTURE Left 07/25/2012   Procedure: OPEN REDUCTION INTERNAL FIXATION (ORIF) Distal RADIus FRACTURE;  Surgeon: Dennie Bible, MD;  Location: [facility];  Service: Plastics;  Laterality: Left;   Social History   Socioeconomic History   Marital status: Married    Spouse name: Not on file   Number of children: 2   Years of education: Not on file   Highest education level: Not on file  Occupational History   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CARPENTER  Tobacco Use   Smoking status: Never   Smokeless tobacco: Never  Substance and Sexual Activity   Alcohol use: Yes    Alcohol/week: 4.0 standard drinks of alcohol    Types: 4 Cans of beer per week    Comment: socially    Drug use: No   Sexual activity: Yes  Other Topics Concern   Not on file  Social History Narrative   Original from [place] but raised in the [place]       Household-- pt , wife, 2 children (2000 male , 61 male)      Social Determinants of Radio broadcast assistant Strain: Not on file  Food Insecurity: Not on file  Transportation Needs: Not on file  Physical Activity: Not on file  Stress: Not on file  Social Connections: Not on file  Intimate Partner Violence: Not on file    Current Outpatient Medications  Medication Instructions   amLODipine (NORVASC) 10 mg, Oral, Daily   Choline & Mag Salicylates (CHOLINE-MAG TRISALICYLATE PO) Oral, +Vitamin C (supplement from [place])   co-enzyme Q-10 30  mg, Oral, Daily   Magnesium 400 MG CAPS Oral       Objective:   Physical Exam BP 134/84   Pulse 78   Temp 98.2 F (36.8 C) (Oral)   Resp 16   Ht '5\' 5"'$  (1.651 m)   Wt 195 lb 4 oz (88.6 kg)   SpO2 98%   BMI 32.49 kg/m  General: Well developed, NAD, BMI noted Neck: No  thyromegaly  HEENT:  Normocephalic . Face symmetric, atraumatic Lungs:  CTA B Normal respiratory effort, no intercostal retractions, no accessory muscle use. Heart: RRR,  no murmur.  Abdomen:  Not distended, soft, non-tender. No rebound or rigidity.   Lower extremities: no pretibial edema bilaterally  DRE: Declined Skin: Exposed areas without rash. Not pale. Not jaundice Neurologic:  alert & oriented X3.  Speech normal, gait appropriate for age and unassisted Strength symmetric and appropriate for age.  Psych: Cognition and judgment appear intact.  Cooperative with normal attention span and concentration.  Behavior appropriate. No anxious or depressed appearing.     Assessment    Assessment  ( reestablished, new patient 01-2022) HTN dx 12-2021 Lipomas + Hemoccult, colonoscopy 08/2012: tics, polyps; next 08-2017   PLAN: Here for CPX HTN: See previous visit, started amlodipine, BP has definitely improved, in the ambulatory  setting is still occasional in the 140s.  Patient request increase amlodipine to 10 mg.  I agree. RTC 6 months

## 2022-05-17 NOTE — Patient Instructions (Addendum)
Call gastroenterology and arrange a colonoscopy: [phone]  Increase amlodipine to 10 mg daily   Watch your salt intake  Check the  blood pressure regularly BP GOAL is between 110/65 and  135/85. If it is consistently higher or lower, let me know  Consider getting a COVID booster    GO TO THE LAB : Get the blood work     [place], [place] back for checkup in 6 months

## 2022-05-18 ENCOUNTER — Encounter: Payer: Self-pay | Admitting: Internal Medicine

## 2022-05-18 NOTE — Assessment & Plan Note (Signed)
Here for CPX HTN: See previous visit, started amlodipine, BP has definitely improved, in the ambulatory setting is still occasional in the 140s.  Patient request increase amlodipine to 10 mg.  I agree. RTC 6 months

## 2022-05-18 NOTE — Assessment & Plan Note (Signed)
Tdap: Today Shingrix: Will discuss at the next visit Flu shot: Today COVID booster: Benefits discussed CCS: Colonoscopy 09/04/2012, Dr. Sharlett Iles, + polyps, tubular adenoma.  Overdue for colonoscopy, referral. Prostate cancer screening: Declined DRE, check PSA Labs:CMP CBC PSA Diet and exercise discussed

## 2022-05-19 LAB — HIV ANTIBODY (ROUTINE TESTING W REFLEX): HIV 1&2 Ab, 4th Generation: NONREACTIVE

## 2022-05-19 LAB — HEPATITIS C ANTIBODY: Hepatitis C Ab: NONREACTIVE

## 2022-05-22 ENCOUNTER — Other Ambulatory Visit: Payer: Self-pay | Admitting: Internal Medicine

## 2022-07-05 ENCOUNTER — Encounter: Payer: Self-pay | Admitting: Gastroenterology

## 2022-07-05 ENCOUNTER — Ambulatory Visit (AMBULATORY_SURGERY_CENTER): Payer: BC Managed Care – PPO | Admitting: *Deleted

## 2022-07-05 VITALS — Ht 65.0 in | Wt 188.0 lb

## 2022-07-05 DIAGNOSIS — Z86010 Personal history of colonic polyps: Secondary | ICD-10-CM

## 2022-07-05 MED ORDER — NA SULFATE-K SULFATE-MG SULF 17.5-3.13-1.6 GM/177ML PO SOLN: 1.0000 | Freq: Once | ORAL | 0 refills | Status: AC

## 2022-07-05 NOTE — Progress Notes (Signed)
Pt's previsit is done over the phone and all paperwork (prep instructions) sent to patient. Pt's name and DOB verified at the beginning of the previsit. Pt denies any difficulty with ambulating.  No egg or soy allerfies No issues being intubated No FH of Malignant Hyperthermia Pt is not on diet pills Pt is not on  home 02  Pt is not on blood thinners  Pt denies issues with constipation  Pt is not on dialysis Pt denies any upcoming cardiac testing Pt encouraged to use to use Singlecare or Goodrx to reduce cost  Patient's chart reviewed by Osvaldo Angst CNRA prior to previsit and patient appropriate for the [place].  Previsit completed and red dot placed by patient's name on their procedure day (on provider's schedule).  . Visit by phone Pt states her weight is 220lb Instructions reviewed with pt and pt states understanding. Instructed to review again prior to procedure. Pt states they will.  Instructions sent by mail with coupon and by my chart

## 2022-07-26 ENCOUNTER — Ambulatory Visit (AMBULATORY_SURGERY_CENTER): Payer: BC Managed Care – PPO | Admitting: Gastroenterology

## 2022-07-26 ENCOUNTER — Encounter: Payer: Self-pay | Admitting: Gastroenterology

## 2022-07-26 VITALS — BP 117/66 | HR 53 | Temp 97.3°F | Resp 18 | Ht 65.0 in | Wt 188.0 lb

## 2022-07-26 DIAGNOSIS — Z09 Encounter for follow-up examination after completed treatment for conditions other than malignant neoplasm: Secondary | ICD-10-CM | POA: Diagnosis present

## 2022-07-26 DIAGNOSIS — Z86010 Personal history of colonic polyps: Secondary | ICD-10-CM

## 2022-07-26 DIAGNOSIS — D123 Benign neoplasm of transverse colon: Secondary | ICD-10-CM

## 2022-07-26 MED ORDER — SODIUM CHLORIDE 0.9 % IV SOLN: 500.0000 mL | INTRAVENOUS | Status: DC

## 2022-07-26 NOTE — Progress Notes (Signed)
Pt's states no medical or surgical changes since previsit or office visit. 

## 2022-07-26 NOTE — Patient Instructions (Signed)
Await pathology results.  Resume previous diet. Continue present medications.  Handout on Polyps, diverticulosis, and hemorrhoids provided.YOU HAD AN ENDOSCOPIC PROCEDURE TODAY AT THE [facility]:   Refer to the procedure report that was given to you for any specific questions about what was found during the examination.  If the procedure report does not answer your questions, please call your gastroenterologist to clarify.  If you requested that your care partner not be given the details of your procedure findings, then the procedure report has been included in a sealed envelope for you to review at your convenience later.  YOU SHOULD EXPECT: Some feelings of bloating in the abdomen. Passage of more gas than usual.  Walking can help get rid of the air that was put into your GI tract during the procedure and reduce the bloating. If you had a lower endoscopy (such as a colonoscopy or flexible sigmoidoscopy) you may notice spotting of blood in your stool or on the toilet paper. If you underwent a bowel prep for your procedure, you may not have a normal bowel movement for a few days.  Please Note:  You might notice some irritation and congestion in your nose or some drainage.  This is from the oxygen used during your procedure.  There is no need for concern and it should clear up in a day or so.  SYMPTOMS TO REPORT IMMEDIATELY:  Following lower endoscopy (colonoscopy or flexible sigmoidoscopy):  Excessive amounts of blood in the stool  Significant tenderness or worsening of abdominal pains  Swelling of the abdomen that is new, acute  Fever of 100F or higher   For urgent or emergent issues, a gastroenterologist can be reached at any hour by calling [phone]. Do not use MyChart messaging for urgent concerns.    DIET:  We do recommend a small meal at first, but then you may proceed to your regular diet.  Drink plenty of fluids but you should avoid alcoholic beverages for 24  hours.  ACTIVITY:  You should plan to take it easy for the rest of today and you should NOT DRIVE or use heavy machinery until tomorrow (because of the sedation medicines used during the test).    FOLLOW UP: Our staff will call the number listed on your records the next business day following your procedure.  We will call around 7:15- 8:00 am to check on you and address any questions or concerns that you may have regarding the information given to you following your procedure. If we do not reach you, we will leave a message.     If any biopsies were taken you will be contacted by phone or by letter within the next 1-3 weeks.  Please call us at [phone] if you have not heard about the biopsies in 3 weeks.    SIGNATURES/CONFIDENTIALITY: You and/or your care partner have signed paperwork which will be entered into your electronic medical record.  These signatures attest to the fact that that the information above on your After Visit Summary has been reviewed and is understood.  Full responsibility of the confidentiality of this discharge information lies with you and/or your care-partner.

## 2022-07-26 NOTE — Progress Notes (Signed)
[facility] History and Physical   Primary Care Physician:  Wanda Plump, MD   Reason for Procedure:   History of colon polyps  Plan:    colonoscopy     HPI: Troy [name]. is a 52 y.o. male  here for colonoscopy surveillance - had a colonoscopy 08/2012 with an adenoma removed. No colonoscopy since that time.   Patient denies any bowel symptoms at this time. No family history of colon cancer known. Otherwise feels well without any cardiopulmonary symptoms.   I have discussed risks / benefits of anesthesia and endoscopic procedure with Michaelene Song. and they wish to proceed with the exams as outlined today.    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 Authorizing Provider  amLODipine (NORVASC) 10 MG tablet Take 1 tablet (10 mg total) by mouth daily. 05/17/22  Yes Wanda Plump, MD  Choline & Mag Salicylates (CHOLINE-MAG TRISALICYLATE PO) Take by mouth. +Vitamin C (supplement from [place])   Yes [provider]  co-enzyme Q-10 30 MG capsule Take 30 mg by mouth daily.   Yes [provider]  Magnesium 400 MG CAPS Take by mouth.   Yes [provider]  naproxen (NAPROSYN) 500 MG tablet Take 1 tablet twice a day by oral route. 08/31/18  Yes [provider]    Current Outpatient Medications  Medication Sig Dispense Refill   amLODipine (NORVASC) 10 MG tablet Take 1 tablet (10 mg total) by mouth daily. 90 tablet 3   Choline & Mag Salicylates (CHOLINE-MAG TRISALICYLATE PO) Take by mouth. +Vitamin C (supplement  from [place])     co-enzyme Q-10 30 MG capsule Take 30 mg by mouth daily.     Magnesium 400 MG CAPS Take by mouth.     naproxen (NAPROSYN) 500 MG tablet Take 1 tablet twice a day by oral route.     Current Facility-Administered Medications  Medication Dose Route Frequency Provider Last Rate Last Admin   0.9 %  sodium chloride infusion  500 mL Intravenous Continuous Zeniya Lapidus, Willaim Rayas, MD        Allergies as of 07/26/2022   (No Known Allergies)    Family History  Problem Relation Age of Onset   Hyperlipidemia Mother        mother    Heart disease Mother 38       MI 54s   Breast cancer Mother    High blood pressure Brother    High blood pressure Brother    Coronary artery disease Maternal Uncle        uncle in their 65s   Heart attack Maternal Uncle    Diabetes Neg Hx    Colon cancer Neg Hx    Prostate cancer Neg Hx    Esophageal cancer Neg Hx    Colon polyps Neg Hx    Rectal cancer Neg Hx    Stomach cancer Neg Hx     Social History   Socioeconomic History   Marital status: Married    Spouse name: Not on  file   Number of children: 2   Years of education: Not on file   Highest education level: Not on file  Occupational History   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[employer]  Tobacco Use   Smoking status: Never   Smokeless tobacco: Never  Substance and Sexual Activity   Alcohol use: Yes    Alcohol/week: 4.0 standard drinks of alcohol    Types: 4 Cans of beer per week    Comment: socially    Drug use: No   Sexual activity: Yes  Other Topics Concern   Not on file  Social History Narrative   Original from [place] but raised in the [place]       Household-- pt , wife, 2 children (2000 male , 2 male)      Social Determinants of Corporate investment banker Strain: Not on file  Food Insecurity: Not on file  Transportation Needs: Not on file  Physical Activity: Not on file  Stress: Not on file  Social Connections: Not on file  Intimate Partner Violence: Not on file     Review of Systems: All other review of systems negative except as mentioned in the HPI.  Physical Exam: Vital signs BP 139/77   Pulse 60   Temp (!) 97.3 F (36.3 C)   Ht  (1.651 m)   Wt 188 lb (85.3 kg)   SpO2 100%   BMI 31.28 kg/m   General:   Alert,  Well-developed, pleasant and cooperative in NAD Lungs:  Clear throughout to auscultation.   Heart:  Regular rate and rhythm Abdomen:  Soft, nontender and nondistended.   Neuro/Psych:  Alert and cooperative. Normal mood and affect. A and O x 3  Harlin Rain, MD [facility]

## 2022-07-26 NOTE — Progress Notes (Signed)
Called to room to assist during endoscopic procedure.  Patient ID and intended procedure confirmed with present staff. Received instructions for my participation in the procedure from the performing physician.  

## 2022-07-26 NOTE — Op Note (Addendum)
[facility] Patient Name: Troy [name] Procedure Date: 07/26/2022 11:38 AM MRN: [id number] Endoscopist: Viviann Spare P. Adela Lank , MD, [id number] Age: 52 Referring MD:  Date of Birth: 09/28/1970 Gender: Male Account #: [id number] Procedure:                Colonoscopy Indications:              High risk colon cancer surveillance: Personal                            history of colonic polyps - adenoma removed 08/2012 Medicines:                Monitored Anesthesia Care Procedure:                Pre-Anesthesia Assessment:                           - Prior to the procedure, a History and Physical                            was performed, and patient medications and                            allergies were reviewed. The patient's tolerance of                            previous anesthesia was also reviewed. The risks                            and benefits of the procedure and the sedation                            options and risks were discussed with the patient.                            All questions were answered, and informed consent                            was obtained. Prior Anticoagulants: The patient has                            taken no anticoagulant or antiplatelet agents. ASA                            Grade Assessment: II - A patient with mild systemic                            disease. After reviewing the risks and benefits,                            the patient was deemed in satisfactory condition to                            undergo the procedure.  After obtaining informed consent, the colonoscope                            was passed under direct vision. Throughout the                            procedure, the patient's blood pressure, pulse, and                            oxygen saturations were monitored continuously. The                            CF [id number] #[id number] was introduced through the anus                            and  advanced to the the cecum, identified by                            appendiceal orifice and ileocecal valve. The                            colonoscopy was performed without difficulty. The                            patient tolerated the procedure well. The quality                            of the bowel preparation was good. The ileocecal                            valve, appendiceal orifice, and rectum were                            photographed. Scope In: 11:46:46 AM Scope Out: 12:01:05 PM Scope Withdrawal Time: 0 hours 12 minutes 27 seconds  Total Procedure Duration: 0 hours 14 minutes 19 seconds  Findings:                 The perianal and digital rectal examinations were                            normal other than small perianal benign appearing                            cyst.                           Two flat and sessile polyps were found in the                            transverse colon. The polyps were 3 to 4 mm in                            size. These polyps were removed with a cold snare.  Resection and retrieval were complete.                           A few small-mouthed diverticula were found in the                            sigmoid colon.                           Internal hemorrhoids were found during                            retroflexion. The hemorrhoids were small.                           The exam was otherwise without abnormality. Complications:            No immediate complications. Estimated blood loss:                            Minimal. Estimated Blood Loss:     Estimated blood loss was minimal. Impression:               - Two 3 to 4 mm polyps in the transverse colon,                            removed with a cold snare. Resected and retrieved.                           - Diverticulosis in the sigmoid colon.                           - Internal hemorrhoids.                           - Small benign appearing perianal cyst                            - The examination was otherwise normal.                           - The GI Genius (intelligent endoscopy module),                            computer-aided polyp detection system powered by AI                            was utilized to detect colorectal polyps through                            enhanced visualization during colonoscopy. Recommendation:           - Patient has a contact number available for                            emergencies. The signs and symptoms of potential  delayed complications were discussed with the                            patient. Return to normal activities tomorrow.                            Written discharge instructions were provided to the                            patient.                           - Resume previous diet.                           - Continue present medications.                           - Await pathology results. Viviann Spare P. Anah Billard, MD 07/26/2022 12:05:43 PM This report has been signed electronically.

## 2022-07-26 NOTE — Progress Notes (Signed)
Sedate, gd SR, tolerated procedure well, VSS, report to RN 

## 2022-07-29 ENCOUNTER — Telehealth: Payer: Self-pay | Admitting: *Deleted

## 2022-07-29 NOTE — Telephone Encounter (Signed)
  Follow up Call-    Row Labels 07/26/2022   11:04 AM  Call back number   Section Header. No data exists in this row.   Post procedure Call Back phone  #   [phone]  Permission to leave phone message   Yes     Patient questions:  Do you have a fever, pain , or abdominal swelling? No. Pain Score  0 *  Have you tolerated food without any problems? Yes.    Have you been able to return to your normal activities? Yes.    Do you have any questions about your discharge instructions: Diet   No. Medications  No. Follow up visit  No.  Do you have questions or concerns about your Care? No.  Actions: * If pain score is 4 or above: No action needed, pain <4.

## 2022-11-15 ENCOUNTER — Ambulatory Visit: Payer: BC Managed Care – PPO | Admitting: Internal Medicine

## 2022-11-15 ENCOUNTER — Encounter: Payer: Self-pay | Admitting: Internal Medicine

## 2023-02-10 ENCOUNTER — Ambulatory Visit: Payer: BC Managed Care – PPO | Admitting: Internal Medicine

## 2023-02-10 ENCOUNTER — Encounter: Payer: Self-pay | Admitting: Internal Medicine

## 2023-02-10 ENCOUNTER — Other Ambulatory Visit: Payer: Self-pay | Admitting: Internal Medicine

## 2023-02-10 VITALS — BP 128/82 | HR 79 | Temp 98.3°F | Resp 16 | Ht 65.0 in | Wt 197.5 lb

## 2023-02-10 DIAGNOSIS — I10 Essential (primary) hypertension: Secondary | ICD-10-CM

## 2023-02-10 DIAGNOSIS — Z23 Encounter for immunization: Secondary | ICD-10-CM

## 2023-02-10 MED ORDER — LOSARTAN POTASSIUM 50 MG PO TABS
50.0000 mg | ORAL_TABLET | Freq: Every day | ORAL | 1 refills | Status: DC
Start: 2023-02-10 — End: 2023-04-10

## 2023-02-10 MED ORDER — AMLODIPINE BESYLATE 5 MG PO TABS: 5.0000 mg | ORAL_TABLET | Freq: Every day | ORAL | 6 refills | Status: DC

## 2023-02-10 NOTE — Progress Notes (Signed)
   Subjective:    Patient ID: Troy Song., male    DOB: 07-30-1970, 52 y.o.   MRN: [id number]  DOS:  02/10/2023 Type of visit - description: rov  Here for hypertension management. Amlodipine was increased to 10 mg for better BP control. Since then, BP is very good but he has developed lower extremity edema from the knee down. Edema is on and off, mostly related to weekends when he is inactive.  Review of Systems See above   Past Medical History:  Diagnosis Date   Lipoma of other skin and subcutaneous tissue    left hip   Unspecified essential hypertension     Past Surgical History:  Procedure Laterality Date   ORIF RADIAL FRACTURE Left 07/25/2012   Procedure: OPEN REDUCTION INTERNAL FIXATION (ORIF) Distal RADIus FRACTURE;  Surgeon: Johnette Abraham, MD;  Location: [facility] OR;  Service: Plastics;  Laterality: Left;    Current Outpatient Medications  Medication Instructions   amLODipine (NORVASC) 10 mg, Oral, Daily   Choline & Mag Salicylates (CHOLINE-MAG TRISALICYLATE PO) Oral, +Vitamin C (supplement from [place])   co-enzyme Q-10 30 mg, Oral, Daily   Magnesium 400 MG CAPS Oral   naproxen (NAPROSYN) 500 MG tablet Take 1 tablet twice a day by oral route.       Objective:   Physical Exam BP 128/82   Pulse 79   Temp 98.3 F (36.8 C) (Oral)   Resp 16   Ht 5\' 5"  (1.651 m)   Wt 197 lb 8 oz (89.6 kg)   SpO2 98%   BMI 32.87 kg/m  General:   Well developed, NAD, BMI noted. HEENT:  Normocephalic . Face symmetric, atraumatic Lungs:  CTA B Normal respiratory effort, no intercostal retractions, no accessory muscle use. Heart: RRR,  no murmur.  Lower extremities: no pretibial edema bilaterally  Skin: Not pale. Not jaundice Neurologic:  alert & oriented X3.  Speech normal, gait appropriate for age and unassisted Psych--  Cognition and judgment appear intact.  Cooperative with normal attention span and concentration.  Behavior appropriate. No anxious or depressed  appearing.      Assessment     Assessment  ( reestablished, new patient 01-2022) HTN dx 12-2021 Lipomas + Hemoccult, colonoscopy 08/2012: tics, polyps; next 08-2017   PLAN: HTN: On February 2024, amlodipine increased from 5 mg to 10 mg, since then BP is much improved however he has developed on and off bilateral lower extremity edema Plan: Change back to amlodipine 5 mg, losartan 50 mg daily, watch salt intake, BMP in 2 weeks.  Monitor BPs.  If they are okay and the swelling resolves,  next visit in February for a physical exam. Preventive care: Flu shot today RTC labs 2 weeks RTC CPX February 2025

## 2023-02-10 NOTE — Assessment & Plan Note (Signed)
HTN: On February 2024, amlodipine increased from 5 mg to 10 mg, since then BP is much improved however he has developed on and off bilateral lower extremity edema Plan: Change back to amlodipine 5 mg, losartan 50 mg daily, watch salt intake, BMP in 2 weeks.  Monitor BPs.  If they are okay and the swelling resolves,  next visit in February for a physical exam. Preventive care: Flu shot today RTC labs 2 weeks RTC CPX February 2025

## 2023-02-10 NOTE — Patient Instructions (Addendum)
Stop amlodipine 10 mg daily Start amlodipine 5 mg a day Start losartan 50 mg a day. You can take both medications together if you like to. Watch your salt intake  Check the  blood pressure regularly Blood pressure goal:  between 110/65 and  135/85. If it is consistently higher or lower, let me know  Stop at the front desk - Get an appointment for blood work in 2 weeks - Get an appointment for a physical exam with me by February 2025    Vaccines I recommend: Covid booster Shingrix (shingles)

## 2023-02-26 ENCOUNTER — Other Ambulatory Visit (INDEPENDENT_AMBULATORY_CARE_PROVIDER_SITE_OTHER): Payer: BC Managed Care – PPO

## 2023-02-26 DIAGNOSIS — I10 Essential (primary) hypertension: Secondary | ICD-10-CM | POA: Diagnosis not present

## 2023-02-27 LAB — BASIC METABOLIC PANEL
BUN: 14 mg/dL (ref 6–23)
CO2: 28 meq/L (ref 19–32)
Calcium: 9.5 mg/dL (ref 8.4–10.5)
Chloride: 104 meq/L (ref 96–112)
Creatinine, Ser: 0.98 mg/dL (ref 0.40–1.50)
GFR: 89.15 mL/min (ref >60.00)
Glucose, Bld: 84 mg/dL (ref 70–99)
Potassium: 4.2 meq/L (ref 3.5–5.1)
Sodium: 141 meq/L (ref 135–145)

## 2023-04-10 ENCOUNTER — Other Ambulatory Visit: Payer: Self-pay | Admitting: Internal Medicine

## 2023-04-10 MED ORDER — AMLODIPINE BESYLATE 5 MG PO TABS: 5.0000 mg | ORAL_TABLET | Freq: Every day | ORAL | 6 refills | Status: DC

## 2023-04-10 NOTE — Telephone Encounter (Signed)
Copied from CRM [id number]. Topic: Clinical - Medication Refill >> Apr 10, 2023  9:38 AM Florestine Avers wrote: Most Recent Primary Care Visit:  Provider: [facility]  Department[facility]  Visit Type: LAB  Date: 02/26/2023  Medication: amLODipine (NORVASC) 5 MG tablet and losartan (COZAAR) 50 MG tablet  Has the patient contacted their pharmacy? Yes (Agent: If no, request that the patient contact the pharmacy for the refill. If patient does not wish to contact the pharmacy document the reason why and proceed with request.) (Agent: If yes, when and what did the pharmacy advise?)  Is this the correct pharmacy for this prescription? Yes If no, delete pharmacy and type the correct one.  This is the patient's preferred pharmacy:  [id number] - [place], [place] - [address] [place] [address] Phone: [phone] Fax: [phone]   Has the prescription been filled recently? Yes  Is the patient out of the medication? Yes  Has the patient been seen for an appointment in the last year OR does the patient have an upcoming appointment? Yes  Can we respond through MyChart? Yes  Agent: Please be advised that Rx refills may take up to 3 business days. We ask that you follow-up with your pharmacy.

## 2023-05-14 ENCOUNTER — Other Ambulatory Visit: Payer: Self-pay | Admitting: Internal Medicine

## 2023-05-22 ENCOUNTER — Other Ambulatory Visit: Payer: Self-pay | Admitting: Internal Medicine

## 2023-06-11 ENCOUNTER — Ambulatory Visit (INDEPENDENT_AMBULATORY_CARE_PROVIDER_SITE_OTHER): Payer: BLUE CROSS/BLUE SHIELD | Admitting: Internal Medicine

## 2023-06-11 ENCOUNTER — Encounter: Payer: Self-pay | Admitting: Internal Medicine

## 2023-06-11 VITALS — BP 128/86 | HR 71 | Temp 99.3°F | Ht 65.0 in | Wt 198.4 lb

## 2023-06-11 DIAGNOSIS — Z0000 Encounter for general adult medical examination without abnormal findings: Secondary | ICD-10-CM | POA: Diagnosis not present

## 2023-06-11 DIAGNOSIS — I10 Essential (primary) hypertension: Secondary | ICD-10-CM

## 2023-06-11 DIAGNOSIS — R739 Hyperglycemia, unspecified: Secondary | ICD-10-CM

## 2023-06-11 MED ORDER — BETAMETHASONE DIPROPIONATE AUG 0.05 % EX CREA: TOPICAL_CREAM | Freq: Two times a day (BID) | CUTANEOUS | 0 refills | Status: AC

## 2023-06-11 NOTE — Patient Instructions (Signed)
  Check the  blood pressure regularly Blood pressure goal:  between 110/65 and  135/85. If it is consistently higher or lower, let me know     GO TO THE LAB : Get the blood work     Please go to the front desk and schedule the following: Office visit in 6 months  For poison ivy: You can use the over-the-counter cream or pick up the prescription I sent.  Call if not better

## 2023-06-11 NOTE — Progress Notes (Unsigned)
   Subjective:    Patient ID: Troy Song., male    DOB: [birth year], 54 y.o.   MRN: [id number]  DOS:  06/11/2023 Type of visit - description: CPX  Since the last office visit is doing well. Ambulatory BPs WNL. Few days ago developed poison ivy, see physical exam.  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 +Vitamin C (supplement from [place])   co-enzyme Q-10 30 mg, Daily   losartan (COZAAR) 50 mg, Oral, Daily   Magnesium 400 MG CAPS Take by mouth.   naproxen (NAPROSYN) 500 MG tablet Take 1 tablet twice a day by oral route.       Objective:   BP 128/86   Pulse 71   Temp 99.3 F (37.4 C)   Ht 5\' 5"  (1.651 m)   Wt 198 lb 6.4 oz (90 kg)   SpO2 96%   BMI 33.02 kg/m     General: Well developed, NAD, BMI noted Neck: No  thyromegaly  HEENT:  Normocephalic . Face symmetric, atraumatic Lungs:  CTA B Normal respiratory effort, no intercostal retractions, no accessory muscle use. Heart: RRR,  no murmur.  Abdomen:  Not distended, soft, non-tender. No rebound or rigidity.   Lower extremities: no pretibial edema bilaterally  Skin: Has a slightly erythematous, blistery rash at the flexor of the arms  R>L neurologic:  alert & oriented X3.  Speech normal, gait appropriate for age and unassisted Strength symmetric and appropriate for age.  Psych: Cognition and judgment appear intact.  Cooperative with normal attention  span and concentration.  Behavior appropriate. No anxious or depressed appearing.    Assessment     Assessment  ( reestablished, new patient 01-2022) HTN dx 12-2021 Lipomas + Hemoccult, colonoscopy 08/2012: tics, polyps; next 08-2017   PLAN:  Tdap: 2024 Had a Flu shot Rec: COVID booster, shingrix CCS: Colonoscopy 09/04/2012, Dr. Jarold Motto,  Cscope 07/2022 next per GI Prostate cancer screening: no sxs, PSA Diet and exercise discussed  HTN: Ambulatory BP in the 120s, continue amlodipine, losartan, checking labs. MSK: On naproxen daily, recommend to take it with food and try to alternate with Tylenol every other day. Poison ivy: Recommend topical steroids, Rx sent.    RTC 6 months The 10-year ASCVD risk score (Arnett DK, et al., 2019) is: 5.2%   Values used to calculate the score:     Age: 59 years     Sex: Male     Is Non-Hispanic African American: No     Diabetic: No     Tobacco smoker: No     Systolic Blood Pressure: 128 mmHg     Is BP treated: Yes     HDL Cholesterol: 52.2 mg/dL     Total Cholesterol: 214 mg/dL

## 2023-06-12 ENCOUNTER — Encounter: Payer: Self-pay | Admitting: Internal Medicine

## 2023-06-12 LAB — COMPREHENSIVE METABOLIC PANEL
ALT: 41 U/L (ref 0–53)
AST: 21 U/L (ref 0–37)
Albumin: 4.7 g/dL (ref 3.5–5.2)
Alkaline Phosphatase: 83 U/L (ref 39–117)
BUN: 16 mg/dL (ref 6–23)
CO2: 30 meq/L (ref 19–32)
Calcium: 9.9 mg/dL (ref 8.4–10.5)
Chloride: 105 meq/L (ref 96–112)
Creatinine, Ser: 0.93 mg/dL (ref 0.40–1.50)
GFR: 94.74 mL/min (ref >60.00)
Glucose, Bld: 90 mg/dL (ref 70–99)
Potassium: 4.1 meq/L (ref 3.5–5.1)
Sodium: 144 meq/L (ref 135–145)
Total Bilirubin: 0.5 mg/dL (ref 0.2–1.2)
Total Protein: 7.2 g/dL (ref 6.0–8.3)

## 2023-06-12 LAB — CBC WITH DIFFERENTIAL/PLATELET
Basophils Absolute: 0.1 10*3/uL (ref 0.0–0.1)
Basophils Relative: 0.7 % (ref 0.0–3.0)
Eosinophils Absolute: 0.2 10*3/uL (ref 0.0–0.7)
Eosinophils Relative: 1.9 % (ref 0.0–5.0)
HCT: 49.7 % (ref 39.0–52.0)
Hemoglobin: 16.3 g/dL (ref 13.0–17.0)
Lymphocytes Relative: 25.1 % (ref 12.0–46.0)
Lymphs Abs: 2.4 10*3/uL (ref 0.7–4.0)
MCHC: 32.8 g/dL (ref 30.0–36.0)
MCV: 93.7 fL (ref 78.0–100.0)
Monocytes Absolute: 1 10*3/uL (ref 0.1–1.0)
Monocytes Relative: 10.2 % (ref 3.0–12.0)
Neutro Abs: 6 10*3/uL (ref 1.4–7.7)
Neutrophils Relative %: 62.1 % (ref 43.0–77.0)
Platelets: 247 10*3/uL (ref 150.0–400.0)
RBC: 5.31 Mil/uL (ref 4.22–5.81)
RDW: 13.4 % (ref 11.5–15.5)
WBC: 9.6 10*3/uL (ref 4.0–10.5)

## 2023-06-12 LAB — LIPID PANEL
Cholesterol: 238 mg/dL — ABNORMAL HIGH (ref 0–200)
HDL: 50 mg/dL (ref >39.00)
LDL Cholesterol: 146 mg/dL — ABNORMAL HIGH (ref 0–99)
NonHDL: 188.29
Total CHOL/HDL Ratio: 5
Triglycerides: 210 mg/dL — ABNORMAL HIGH (ref 0.0–149.0)
VLDL: 42 mg/dL — ABNORMAL HIGH (ref 0.0–40.0)

## 2023-06-12 LAB — PSA: PSA: 2.18 ng/mL (ref 0.10–4.00)

## 2023-06-12 LAB — HEMOGLOBIN A1C: Hgb A1c MFr Bld: 6 % (ref 4.6–6.5)

## 2023-06-12 NOTE — Assessment & Plan Note (Signed)
 Here for CPX  Other issues addressed: HTN: Ambulatory BP in the 120s, continue amlodipine, losartan, checking labs. MSK: On naproxen daily, recommend to take it with food and try to alternate with Tylenol every other day. Poison ivy: Recommend topical steroids, Rx sent.   RTC 6 months

## 2023-06-12 NOTE — Assessment & Plan Note (Signed)
 Here for CPX Tdap: 2024 Had a Flu shot Rec: COVID booster, shingrix CCS: Colonoscopy 09/04/2012, Dr. Jarold Motto,  Cscope 07/2022 -- next per GI Prostate cancer screening: no sxs, PSA Diet and exercise discussed

## 2023-06-13 ENCOUNTER — Encounter: Payer: Self-pay | Admitting: Internal Medicine

## 2023-06-24 ENCOUNTER — Other Ambulatory Visit: Payer: Self-pay | Admitting: Internal Medicine

## 2023-06-24 MED ORDER — LOSARTAN POTASSIUM 50 MG PO TABS: 50.0000 mg | ORAL_TABLET | Freq: Every day | ORAL | 1 refills | Status: DC

## 2023-09-29 ENCOUNTER — Other Ambulatory Visit: Payer: Self-pay | Admitting: Internal Medicine

## 2023-09-29 NOTE — Telephone Encounter (Unsigned)
 Copied from CRM [id number]. Topic: Clinical - Medication Refill >> Sep 29, 2023 11:25 AM Shereese L wrote: Medication: amLODipine  (NORVASC ) 5 MG tablet    Has the patient contacted their pharmacy? Yes (Agent: If no, request that the patient contact the pharmacy for the refill. If patient does not wish to contact the pharmacy document the reason why and proceed with request.) (Agent: If yes, when and what did the pharmacy advise?)  This is the patient's preferred pharmacy:  [id number] - [place], [place] - [address] [place] [address] Phone: [phone] Fax: [phone]  Is this the correct pharmacy for this prescription? Yes If no, delete pharmacy and type the correct one.   Has the prescription been filled recently? Yes  Is the patient out of the medication? Yes  Has the patient been seen for an appointment in the last year OR does the patient have an upcoming appointment? Yes  Can we respond through MyChart? Yes  Agent: Please be advised that Rx refills may take up to 3 business days. We ask that you follow-up with your pharmacy.

## 2023-10-01 ENCOUNTER — Other Ambulatory Visit: Payer: Self-pay | Admitting: Internal Medicine

## 2023-10-20 ENCOUNTER — Ambulatory Visit: Payer: Self-pay

## 2023-10-20 NOTE — Telephone Encounter (Signed)
  FYI Only or Action Required?: Action required by provider: clinical question for provider.  Patient was last seen in primary care on 06/11/2023 by Troy Aloysius BRAVO, MD.  Called Nurse Triage reporting Advice Only.  Symptoms began today.  Interventions attempted: Other: n/a.  Symptoms are: n/a.  Triage Disposition: Call PCP When Office is Open  Patient/caregiver understands and will follow disposition?: Yes Copied from CRM [id number]. Topic: Clinical - Medical Advice >> Oct 20, 2023  5:33 PM Troy [name] wrote: Reason for CRM: Patient called in asking what is his blood type. He has a trip and needs to fill out medical forms that asked his blood type. Patients contact is [phone]. Reason for Disposition  [1] Caller requesting NON-URGENT health information AND [2] PCP's office is the best resource  Answer Assessment - Initial Assessment Questions 1. REASON FOR CALL or QUESTION: What is your reason for calling today? or How can I best help you? or What question do you have that I can help answer?     Patient needs to know blood type for travel planned for 10/25/2023.  No typing seen in chart. Patient would like orders for labs this week to know blood type  Protocols used: Information Only Call - No Triage-A-AH

## 2023-10-21 NOTE — Telephone Encounter (Signed)
 Okay to place the order, make patient aware insurance may not pay for it. Not sure what diagnosis we could use other than CPX Alternatively, he could go to donate blood and they will tell him what blood type he is

## 2023-10-21 NOTE — Telephone Encounter (Signed)
 Please see below,  pt is asking for blood type prior to upcoming travel.  Please advise if ok to place lab order?

## 2023-10-21 NOTE — Telephone Encounter (Signed)
 Notified pt. He states he doesn't have enough time before he leaves for his trip to have testing done. He will contact us  when he returns if testing is still desired/needed.

## 2023-12-10 ENCOUNTER — Encounter: Payer: Self-pay | Admitting: Internal Medicine

## 2023-12-10 ENCOUNTER — Ambulatory Visit: Payer: BLUE CROSS/BLUE SHIELD | Admitting: Internal Medicine

## 2023-12-10 VITALS — BP 132/78 | HR 46 | Temp 98.0°F | Resp 16 | Ht 65.0 in | Wt 200.0 lb

## 2023-12-10 DIAGNOSIS — R159 Full incontinence of feces: Secondary | ICD-10-CM

## 2023-12-10 DIAGNOSIS — K648 Other hemorrhoids: Secondary | ICD-10-CM | POA: Diagnosis not present

## 2023-12-10 MED ORDER — HYDROCORTISONE ACETATE 25 MG RE SUPP: 25.0000 mg | Freq: Two times a day (BID) | RECTAL | 1 refills | Status: AC | PRN

## 2023-12-10 MED ORDER — LIDOCAINE 5 % EX OINT: 1.0000 | TOPICAL_OINTMENT | CUTANEOUS | 0 refills | Status: AC | PRN

## 2023-12-10 NOTE — Progress Notes (Unsigned)
 Subjective:    Patient ID: Troy DELENA Tamea Mickey., male    DOB: Dec 09, 1970, 53 y.o.   MRN: [id number]  DOS:  12/10/2023 Type of visit - description: Routine checkup  Chronic medical problems addressed. Also reports 6 months history of anorectal leaking. From time to time sees clear discharge and sometimes stools without any warning. Other than that, denies anorectal pain, itching. Bowel movements are regular.  No constipation.  Stool color is normal.  No weight loss.  No diarrhea.  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 +Vitamin C (supplement from [place])   co-enzyme Q-10 30 mg, Daily   losartan  (COZAAR ) 50 mg, Oral, Daily   Magnesium 400 MG CAPS Take by mouth.   naproxen (NAPROSYN) 500 MG tablet Take 1 tablet twice a day by oral route.       Objective:   Physical Exam BP 132/78   Pulse (!) 46   Temp 98 F (36.7 C) (Oral)   Resp 16   Ht 5' 5 (1.651 m)   Wt 200 lb (90.7 kg)   SpO2 97%   BMI 33.28 kg/m  General:   Well developed, NAD, BMI noted.  HEENT:  Normocephalic . Face symmetric, atraumatic Abdomen:  Not distended, soft, non-tender. No rebound or rigidity. DRE: Normal prostate, brown stools, no mass. Anoscopy: Multiple internal hemorrhoids.  At 12:00 there is a area where I see some white  discharge.  No obvious abscess or openings on rectal exam or anoscopy. Skin: Not pale. Not jaundice Lower extremities: no pretibial edema bilaterally  Neurologic:  alert & oriented X3.  Speech normal, gait appropriate for age and unassisted Psych--  Cognition and judgment appear intact.  Cooperative with normal attention span and concentration.  Behavior appropriate. No anxious or depressed appearing.     Assessment   Assessment  ( reestablished, new patient 01-2022) HTN dx 12-2021 Lipomas + Hemoccult, colonoscopy 08/2012: tics, polyps; C-scope 07/2022.  Internal hemorrhoids.  Polyps, 7 years   PLAN: The 10-year ASCVD risk score (Arnett DK, et al., 2019) is: 6.5% HTN: Seems well-controlled, no change. Hemorrhoids, ano- rectal leakage. Symptoms as described above, on anoscopy I see a small amount of white discharge.  There is no obvious abscess that I can tell, he denies fever or chills. Had a colonoscopy 07/2022, + internal hemorrhoids Plan: Anusol  suppositories, lidocaine .  Will refer to GI as well.  Although he has hemorrhoids I not sure why I see some white discharge during the anoscopy. RTC 05-2024 CPX 2-25 Here for CPX Tdap: 2024 Had a Flu shot Rec: COVID booster, shingrix CCS: Colonoscopy 09/04/2012, Dr. Jakie,  Cscope 07/2022 -- next per GI Prostate cancer screening: no sxs, PSA Diet and exercise discussed Other issues addressed: HTN: Ambulatory BP in the 120s, continue amlodipine , losartan , checking labs. MSK: On naproxen  daily, recommend to take it with food and try to alternate with Tylenol  every other day. Poison ivy: Recommend topical steroids, Rx sent.   RTC 6 months

## 2023-12-10 NOTE — Patient Instructions (Signed)
 Apply 1 suppository daily for the next 4 to 5 days  Applied a lidocaine  cream if you have discomfort or itching.  We are referring you to gastroenterology. You can reach them at [phone]  Call anytime if symptoms worsen    Go to the front desk for the checkout Please make an appointment for a physical exam by 05-2024

## 2023-12-11 NOTE — Assessment & Plan Note (Signed)
 HTN: Seems well-controlled, no change. Hemorrhoids, ano- rectal leakage. Symptoms as described above, on anoscopy I see a small amount of white discharge.  There is no obvious abscess that I can tell, he denies fever or chills. Had a colonoscopy 07/2022, + internal hemorrhoids Plan: Anusol  suppositories, lidocaine .  Will refer to GI as well.  Although he has hemorrhoids I not sure why I see some white discharge during the anoscopy.  Further eval? RTC 05-2024

## 2023-12-21 ENCOUNTER — Other Ambulatory Visit: Payer: Self-pay | Admitting: Internal Medicine

## 2024-03-24 ENCOUNTER — Other Ambulatory Visit: Payer: Self-pay | Admitting: Internal Medicine

## 2024-03-29 ENCOUNTER — Other Ambulatory Visit: Payer: Self-pay | Admitting: Internal Medicine

## 2024-04-25 NOTE — Progress Notes (Unsigned)
 "  HPI :    Colonoscopy 07/26/22: - The perianal and digital rectal examinations were normal other than small perianal benign appearing cyst. Findings: - Two flat and sessile polyps were found in the transverse colon. The polyps were 3 to 4 mm in size. These polyps were removed with a cold snare. Resection and retrieval were complete. - A few small-mouthed diverticula were found in the sigmoid colon. - Internal hemorrhoids were found during retroflexion. The hemorrhoids were small. - The exam was otherwise without abnormality.  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    left hip   Unspecified essential hypertension      Past Surgical History:  Procedure Laterality Date   ORIF RADIAL FRACTURE Left 07/25/2012   Procedure: OPEN REDUCTION INTERNAL FIXATION (ORIF) Distal RADIus FRACTURE;  Surgeon: Balinda JAYSON Rogue, MD;  Location: [facility] OR;  Service: Plastics;  Laterality: Left;   Family History  Problem Relation Age of Onset   Hyperlipidemia Mother        mother    Heart disease Mother 61       MI 51s   Breast cancer Mother    High blood pressure Brother    High blood pressure Brother    Coronary artery disease Maternal Uncle        uncle in their 41s   Heart attack Maternal Uncle    Diabetes Neg Hx    Colon cancer Neg Hx    Prostate cancer Neg Hx    Esophageal cancer Neg Hx    Colon polyps Neg Hx    Rectal cancer Neg Hx    Stomach cancer Neg Hx    Social History[1] Current Outpatient Medications  Medication Sig Dispense Refill   losartan  (COZAAR ) 50 MG tablet Take 1 tablet (50 mg total) by mouth daily. 90 tablet 1   amLODipine  (NORVASC ) 5 MG tablet Take 1 tablet (5 mg total) by mouth daily. 90 tablet 1   augmented betamethasone  dipropionate (DIPROLENE -AF) 0.05 % cream Apply topically 2  (two) times daily. 60 g 0   Choline & Mag Salicylates (CHOLINE-MAG TRISALICYLATE PO) Take by mouth. +Vitamin C (supplement from [place])     co-enzyme Q-10 30 MG capsule Take 30 mg by mouth daily.     hydrocortisone  (ANUSOL -HC) 25 MG suppository Place 1 suppository (25 mg total) rectally 2 (two) times daily as needed for hemorrhoids. 12 suppository 1   lidocaine  (XYLOCAINE ) 5 % ointment Apply 1 Application topically as needed. 35.44 g 0   Magnesium 400 MG CAPS Take by mouth.     naproxen (NAPROSYN) 500 MG tablet Take 1 tablet twice a day by oral route.     No current facility-administered medications for this visit.   Allergies[2]   Review of Systems: All systems reviewed and negative except where noted in HPI.    No results found.  Physical Exam: There were no vitals taken for this visit. Constitutional: Pleasant,well-developed, ***male in no acute distress. HEENT: Normocephalic and atraumatic. Conjunctivae are normal. No scleral icterus. Neck supple.  Cardiovascular: Normal rate, regular rhythm.  Pulmonary/chest: Effort normal and breath sounds normal. No wheezing, rales or rhonchi. Abdominal: Soft, nondistended, nontender. Bowel sounds active throughout. There are no masses palpable. No hepatomegaly. Extremities: no edema Lymphadenopathy: No cervical adenopathy noted. Neurological: Alert and oriented to person place  and time. Skin: Skin is warm and dry. No rashes noted. Psychiatric: Normal mood and affect. Behavior is normal.   ASSESSMENT: 54 y.o. male here for assessment of the following  No diagnosis found.  PLAN:   Amon Aloysius BRAVO, MD    [1]  Social History Tobacco Use   Smoking status: Never   Smokeless tobacco: Never  Substance Use Topics   Alcohol use: Yes    Alcohol/week: 4.0 standard drinks of alcohol    Types: 4 Cans of beer per week    Comment: socially    Drug use: No  [2] No Known Allergies  "

## 2024-04-26 ENCOUNTER — Ambulatory Visit: Admitting: Gastroenterology

## 2024-06-09 ENCOUNTER — Ambulatory Visit: Admitting: Internal Medicine
